# Patient Record
Sex: Female | Born: 1995 | Race: Black or African American | Hispanic: No | Marital: Single | Smoking: Never smoker
Health system: Southern US, Community
[De-identification: ages and names within clinical notes are randomized; demographics above are authoritative.]

## PROBLEM LIST (undated history)

## (undated) ENCOUNTER — Inpatient Hospital Stay (HOSPITAL_COMMUNITY): Payer: Self-pay

## (undated) DIAGNOSIS — I514 Myocarditis, unspecified: Secondary | ICD-10-CM

---

## 2016-07-26 ENCOUNTER — Emergency Department (HOSPITAL_COMMUNITY)
Admission: EM | Admit: 2016-07-26 | Discharge: 2016-07-27 | Disposition: A | Discharge status: Home / Self Care | Payer: Medicaid Other | Attending: Emergency Medicine | Admitting: Emergency Medicine

## 2016-07-26 ENCOUNTER — Encounter (HOSPITAL_COMMUNITY): Payer: Self-pay | Admitting: Emergency Medicine

## 2016-07-26 DIAGNOSIS — Z91040 Latex allergy status: Secondary | ICD-10-CM | POA: Diagnosis not present

## 2016-07-26 DIAGNOSIS — Z3A01 Less than 8 weeks gestation of pregnancy: Secondary | ICD-10-CM | POA: Diagnosis not present

## 2016-07-26 DIAGNOSIS — N898 Other specified noninflammatory disorders of vagina: Secondary | ICD-10-CM

## 2016-07-26 DIAGNOSIS — R102 Pelvic and perineal pain: Secondary | ICD-10-CM | POA: Diagnosis not present

## 2016-07-26 DIAGNOSIS — O209 Hemorrhage in early pregnancy, unspecified: Secondary | ICD-10-CM | POA: Insufficient documentation

## 2016-07-26 LAB — WET PREP, GENITAL
Clue Cells Wet Prep HPF POC: NONE SEEN
Sperm: NONE SEEN
Trich, Wet Prep: NONE SEEN
Yeast Wet Prep HPF POC: NONE SEEN

## 2016-07-26 LAB — URINALYSIS, ROUTINE W REFLEX MICROSCOPIC
BILIRUBIN URINE: NEGATIVE
Glucose, UA: NEGATIVE mg/dL
HGB URINE DIPSTICK: NEGATIVE
KETONES UR: NEGATIVE mg/dL
NITRITE: NEGATIVE
PROTEIN: NEGATIVE mg/dL
Specific Gravity, Urine: 1.025 (ref 1.005–1.030)
pH: 5 (ref 5.0–8.0)

## 2016-07-26 LAB — HCG, QUANTITATIVE, PREGNANCY: hCG, Beta Chain, Quant, S: 6125 m[IU]/mL — ABNORMAL HIGH (ref <5)

## 2016-07-26 LAB — POC URINE PREG, ED: Preg Test, Ur: POSITIVE — AB

## 2016-07-26 NOTE — ED Provider Notes (Signed)
[facility]-EMERGENCY DEPT Provider Note   CSN: [id number] Arrival date & time: 07/26/16  1950  By signing my name below, I, Soijett Blue, attest that this documentation has been prepared under the direction and in the presence of Burna FortsJeff Jacelyn Cuen, PA-C Electronically Signed: Soijett Blue, ED Scribe. 07/26/16. 9:36 PM.  History   Chief Complaint Chief Complaint  Patient presents with  . Vaginal Discharge    HPI Nicole Mosley is a 21 y.o. female who presents to the Emergency Department complaining of white cloudy vaginal discharge onset 1 week ago. Pt notes that she took a generic plan B 8 days ago prior to the onset of her symptoms. Patient's last menstrual period was 06/15/2016 and she hasn't had a menstrual cycle for the month of January. Pt notes that she has been pregnant in the past that resulted in a miscarriage at 2 weeks. She is having associated symptoms of lower abdominal cramping. She denies vaginal bleeding, difficulty urinating, and any other symptoms.     The history is provided by the patient. No language interpreter was used.    History reviewed. No pertinent past medical history.  There are no active problems to display for this patient.   History reviewed. No pertinent surgical history.  OB History    No data available       Home Medications    Prior to Admission medications   Medication Sig Start Date End Date Taking? Authorizing Provider  Prenatal Vit-Fe Fumarate-FA (PRENATAL COMPLETE) 14-0.4 MG TABS Take 1 tablet by mouth daily. 07/27/16   Eyvonne MechanicJeffrey Nephtali Docken, PA-C    Family History History reviewed. No pertinent family history.  Social History Social History  Substance Use Topics  . Smoking status: Never Smoker  . Smokeless tobacco: Never Used  . Alcohol use Yes     Comment: occ     Allergies   Latex   Review of Systems Review of Systems  Gastrointestinal: Positive for abdominal pain (cramping).  Genitourinary: Positive for vaginal  discharge. Negative for difficulty urinating and vaginal bleeding.   Physical Exam Updated Vital Signs BP 94/81 (BP Location: Right Arm)   Pulse 75   Resp 16   Ht 5\' 2"  (1.575 m)   Wt 53.5 kg   LMP 06/15/2016 (Exact Date)   SpO2 100%   BMI 21.58 kg/m   Physical Exam  Constitutional: She is oriented to person, place, and time. She appears well-developed and well-nourished. No distress.  HENT:  Head: Normocephalic and atraumatic.  Eyes: EOM are normal.  Neck: Neck supple.  Cardiovascular: Normal rate.   Pulmonary/Chest: Effort normal. No respiratory distress.  Abdominal: Soft. She exhibits no distension. There is tenderness in the suprapubic area.  Minimal suprapubic tenderness.  Genitourinary:  Genitourinary Comments: Chaperone present for exam. Small amount of nonpurulent vaginal discharge- no cervical motion tenderness, cervical os closed  Musculoskeletal: Normal range of motion.  Neurological: She is alert and oriented to person, place, and time.  Skin: Skin is warm and dry.  Psychiatric: She has a normal mood and affect. Her behavior is normal.  Nursing note and vitals reviewed.  ED Treatments / Results  DIAGNOSTIC STUDIES:   COORDINATION OF CARE: 9:33 PM Discussed treatment plan with pt at bedside which includes labs, UA, GC/Chlamydia probe, and pt agreed to plan.   Labs (all labs ordered are listed, but only abnormal results are displayed) Labs Reviewed  WET PREP, GENITAL - Abnormal; Notable for the following:       Result Value  WBC, Wet Prep HPF POC MANY (*)    All other components within normal limits  URINALYSIS, ROUTINE W REFLEX MICROSCOPIC - Abnormal; Notable for the following:    Leukocytes, UA SMALL (*)    Bacteria, UA RARE (*)    Squamous Epithelial / LPF 0-5 (*)    All other components within normal limits  HCG, QUANTITATIVE, PREGNANCY - Abnormal; Notable for the following:    hCG, Beta Chain, Quant, S 6,125 (*)    All other components within  normal limits  POC URINE PREG, ED - Abnormal; Notable for the following:    Preg Test, Ur POSITIVE (*)    All other components within normal limits  GC/CHLAMYDIA PROBE AMP ([place]) NOT AT [facility]    EKG  EKG Interpretation None       Radiology US Ob Comp Less 14 Wks  Result Date: 07/27/2016 CLINICAL DATA:  21 y/o F; 1 week of pelvic pain and vaginal discharge. Beta HCG 6,125. EXAM: OBSTETRIC <14 WK [place] AND TRANSVAGINAL OB US DOPPLER ULTRASOUND OF OVARIES TECHNIQUE: Both transabdominal and transvaginal ultrasound examinations were performed for complete evaluation of the gestation as well as the maternal uterus, adnexal regions, and pelvic cul-de-sac. Transvaginal technique was performed to assess early pregnancy. Color and duplex Doppler ultrasound was utilized to evaluate blood flow to the ovaries. COMPARISON:  None. FINDINGS: Intrauterine gestational sac: Single Yolk sac:  Not Visualized. Embryo:  Not Visualized. Cardiac Activity: Not Visualized. MSD: 6  mm   5 w   2  d Subchorionic hemorrhage:  None visualized. Maternal uterus/adnexae: The right ovary measures 4.5 x 2.9 x 3.3 cm. There are 2 cysts with internal echoes 1 measuring up to 1.9 cm and the other measuring up to 2.2 cm likely representing hemorrhagic cysts and/or a corpus luteum. The left ovary measures 3.0 x 1.7 x 2.7 cm. Pulsed Doppler evaluation of both ovaries demonstrates normal appearing low-resistance arterial and venous waveforms. IMPRESSION: 1. Probable early intrauterine gestational sac, but no yolk sac, fetal pole, or cardiac activity yet visualized. Recommend follow-up quantitative B-HCG levels and follow-up US in 14 days to assess viability. This recommendation follows SRU consensus guidelines: Diagnostic Criteria for Nonviable Pregnancy Early in the First Trimester. Malva Limes Med 2013; 454:0981-19. 2. Right ovary complex cyst measuring up to 2.2 cm likely representing a hemorrhagic cyst and corpus luteum. 3. No evidence of  ovarian torsion at this time. Electronically Signed   By: Mitzi Hansen M.D.   On: 07/27/2016 01:06   US Ob Transvaginal  Result Date: 07/27/2016 CLINICAL DATA:  21 y/o F; 1 week of pelvic pain and vaginal discharge. Beta HCG 6,125. EXAM: OBSTETRIC <14 WK [place] AND TRANSVAGINAL OB US DOPPLER ULTRASOUND OF OVARIES TECHNIQUE: Both transabdominal and transvaginal ultrasound examinations were performed for complete evaluation of the gestation as well as the maternal uterus, adnexal regions, and pelvic cul-de-sac. Transvaginal technique was performed to assess early pregnancy. Color and duplex Doppler ultrasound was utilized to evaluate blood flow to the ovaries. COMPARISON:  None. FINDINGS: Intrauterine gestational sac: Single Yolk sac:  Not Visualized. Embryo:  Not Visualized. Cardiac Activity: Not Visualized. MSD: 6  mm   5 w   2  d Subchorionic hemorrhage:  None visualized. Maternal uterus/adnexae: The right ovary measures 4.5 x 2.9 x 3.3 cm. There are 2 cysts with internal echoes 1 measuring up to 1.9 cm and the other measuring up to 2.2 cm likely representing hemorrhagic cysts and/or a corpus luteum. The left ovary  measures 3.0 x 1.7 x 2.7 cm. Pulsed Doppler evaluation of both ovaries demonstrates normal appearing low-resistance arterial and venous waveforms. IMPRESSION: 1. Probable early intrauterine gestational sac, but no yolk sac, fetal pole, or cardiac activity yet visualized. Recommend follow-up quantitative B-HCG levels and follow-up US in 14 days to assess viability. This recommendation follows SRU consensus guidelines: Diagnostic Criteria for Nonviable Pregnancy Early in the First Trimester. Malva Limes Med 2013; 161:0960-45. 2. Right ovary complex cyst measuring up to 2.2 cm likely representing a hemorrhagic cyst and corpus luteum. 3. No evidence of ovarian torsion at this time. Electronically Signed   By: Mitzi Hansen M.D.   On: 07/27/2016 01:06   [place] Art/ven Flow Abd Pelv  Doppler  Result Date: 07/27/2016 CLINICAL DATA:  21 y/o F; 1 week of pelvic pain and vaginal discharge. Beta HCG 6,125. EXAM: OBSTETRIC <14 WK [place] AND TRANSVAGINAL OB US DOPPLER ULTRASOUND OF OVARIES TECHNIQUE: Both transabdominal and transvaginal ultrasound examinations were performed for complete evaluation of the gestation as well as the maternal uterus, adnexal regions, and pelvic cul-de-sac. Transvaginal technique was performed to assess early pregnancy. Color and duplex Doppler ultrasound was utilized to evaluate blood flow to the ovaries. COMPARISON:  None. FINDINGS: Intrauterine gestational sac: Single Yolk sac:  Not Visualized. Embryo:  Not Visualized. Cardiac Activity: Not Visualized. MSD: 6  mm   5 w   2  d Subchorionic hemorrhage:  None visualized. Maternal uterus/adnexae: The right ovary measures 4.5 x 2.9 x 3.3 cm. There are 2 cysts with internal echoes 1 measuring up to 1.9 cm and the other measuring up to 2.2 cm likely representing hemorrhagic cysts and/or a corpus luteum. The left ovary measures 3.0 x 1.7 x 2.7 cm. Pulsed Doppler evaluation of both ovaries demonstrates normal appearing low-resistance arterial and venous waveforms. IMPRESSION: 1. Probable early intrauterine gestational sac, but no yolk sac, fetal pole, or cardiac activity yet visualized. Recommend follow-up quantitative B-HCG levels and follow-up US in 14 days to assess viability. This recommendation follows SRU consensus guidelines: Diagnostic Criteria for Nonviable Pregnancy Early in the First Trimester. Malva Limes Med 2013; 409:8119-14. 2. Right ovary complex cyst measuring up to 2.2 cm likely representing a hemorrhagic cyst and corpus luteum. 3. No evidence of ovarian torsion at this time. Electronically Signed   By: Mitzi Hansen M.D.   On: 07/27/2016 01:06    Procedures Procedures (including critical care time)  Medications Ordered in ED Medications  acetaminophen (TYLENOL) tablet 650 mg (650 mg Oral Given  07/27/16 0147)     Initial Impression / Assessment and Plan / ED Course  I have reviewed the triage vital signs and the nursing notes.  Pertinent labs & imaging results that were available during my care of the patient were reviewed by me and considered in my medical decision making (see chart for details).  Clinical Course     Labs: HCG 6125  Imaging: Ultrasound  Consults:   Therapeutics: Tylenol  Discharge Meds: Prenatal vitamins  Assessment/Plan: 21 year old female presents today with vaginal discharge and pelvic pain. Patient pregnancy test positive, hCG 6125. Ultrasound shows likely intrauterine pregnancy, but will need follow-up evaluation in 2 weeks for this. Patient is very well-appearing with soft abdomen. Her abdominal pain is coming and going; no bleeding. No signs of torsion, no signs of ectopic at this time. Patient started on prenatal vitamins, encouraged follow-up with [facility], and repeat ultrasound in 2 weeks. She is given strict return cautioned, she verbalized understanding and agreement  to today's plan had no further questions or concerns  Final Clinical Impressions(s) / ED Diagnoses   Final diagnoses:  Pelvic pain  Less than [redacted] weeks gestation of pregnancy  Vaginal discharge    New Prescriptions Discharge Medication List as of 07/27/2016  1:49 AM    START taking these medications   Details  Prenatal Vit-Fe Fumarate-FA (PRENATAL COMPLETE) 14-0.4 MG TABS Take 1 tablet by mouth daily., Starting Tue 07/27/2016, Print       I personally performed the services described in this documentation, which was scribed in my presence. The recorded information has been reviewed and is accurate.     Eyvonne Mechanic, PA-C 07/27/16 0501    Lyndal Pulley, MD 07/27/16 [phone]

## 2016-07-26 NOTE — ED Triage Notes (Signed)
Pt from home with complaint "I took plan B last Sunday And since I've had abd pain, breast tenderness, and vaginal irritation/discharge" Denies vomiting/diarrhea.

## 2016-07-27 ENCOUNTER — Emergency Department (HOSPITAL_COMMUNITY): Payer: Medicaid Other

## 2016-07-27 LAB — GC/CHLAMYDIA PROBE AMP (~~LOC~~) NOT AT ARMC
Chlamydia: NEGATIVE
Neisseria Gonorrhea: NEGATIVE

## 2016-07-27 MED ORDER — ACETAMINOPHEN 325 MG PO TABS
650.0000 mg | ORAL_TABLET | Freq: Once | ORAL | Status: AC
  Administered 2016-07-27: 650 mg via ORAL
  Filled 2016-07-27: qty 2

## 2016-07-27 MED ORDER — PRENATAL COMPLETE 14-0.4 MG PO TABS: 1.0000 | ORAL_TABLET | Freq: Every day | ORAL | 0 refills | Status: AC

## 2016-07-27 NOTE — Discharge Instructions (Signed)
Please read attached information. If you experience any new or worsening signs or symptoms please return to the emergency room for evaluation. [facility] and [facility]. You will need repeat ultrasound in two weeks.. Please use medication prescribed only as directed and discontinue taking if you have any concerning signs or symptoms.

## 2016-07-27 NOTE — ED Notes (Signed)
Patient transported to US 

## 2016-07-27 NOTE — ED Notes (Signed)
Pt returned from US

## 2016-07-27 NOTE — ED Notes (Signed)
Pt unable to sign upon discharge due to signature pad not working. Pt verbalized understanding of discharge instructions, follow up care and prescriptions. Pt has no further questions. VSS.

## 2016-07-30 ENCOUNTER — Telehealth: Payer: Self-pay | Admitting: *Deleted

## 2016-07-30 DIAGNOSIS — O3680X0 Pregnancy with inconclusive fetal viability, not applicable or unspecified: Secondary | ICD-10-CM

## 2016-07-30 NOTE — Telephone Encounter (Signed)
Patient called, stated she was in ed on the 15th and was told she needed to call clinic to schedule a f/u u/s   1140 Scheduled u/s for 1/19@1400 . Called patient and notified her of appt. Understanding voiced

## 2016-08-09 ENCOUNTER — Encounter: Payer: Self-pay | Admitting: Obstetrics and Gynecology

## 2016-08-09 ENCOUNTER — Ambulatory Visit: Payer: Self-pay | Admitting: *Deleted

## 2016-08-09 ENCOUNTER — Ambulatory Visit (HOSPITAL_COMMUNITY)
Admission: RE | Admit: 2016-08-09 | Discharge: 2016-08-09 | Disposition: A | Discharge status: Home / Self Care | Payer: Medicaid Other | Source: Ambulatory Visit | Attending: Obstetrics and Gynecology | Admitting: Obstetrics and Gynecology

## 2016-08-09 ENCOUNTER — Ambulatory Visit (HOSPITAL_COMMUNITY): Payer: Self-pay

## 2016-08-09 ENCOUNTER — Other Ambulatory Visit: Payer: Self-pay | Admitting: Obstetrics and Gynecology

## 2016-08-09 DIAGNOSIS — O3680X0 Pregnancy with inconclusive fetal viability, not applicable or unspecified: Secondary | ICD-10-CM | POA: Insufficient documentation

## 2016-08-09 DIAGNOSIS — Z3A01 Less than 8 weeks gestation of pregnancy: Secondary | ICD-10-CM | POA: Insufficient documentation

## 2016-08-09 DIAGNOSIS — Z712 Person consulting for explanation of examination or test findings: Secondary | ICD-10-CM

## 2016-08-09 NOTE — Progress Notes (Signed)
Single viable IUP verified by US today.  EDD - 03/31/17.  Pt informed of ultrasound results after reviewed with Dr. Shawnie PonsPratt. Medication reconciliation completed.

## 2016-11-18 ENCOUNTER — Inpatient Hospital Stay (HOSPITAL_COMMUNITY)
Admission: AD | Admit: 2016-11-18 | Discharge: 2016-11-19 | Disposition: A | Discharge status: Home / Self Care | Payer: Medicaid Other | Source: Ambulatory Visit | Attending: Obstetrics and Gynecology | Admitting: Obstetrics and Gynecology

## 2016-11-18 DIAGNOSIS — Z3A21 21 weeks gestation of pregnancy: Secondary | ICD-10-CM

## 2016-11-18 DIAGNOSIS — O26892 Other specified pregnancy related conditions, second trimester: Secondary | ICD-10-CM | POA: Diagnosis not present

## 2016-11-18 DIAGNOSIS — O9989 Other specified diseases and conditions complicating pregnancy, childbirth and the puerperium: Secondary | ICD-10-CM | POA: Diagnosis not present

## 2016-11-18 DIAGNOSIS — A084 Viral intestinal infection, unspecified: Secondary | ICD-10-CM | POA: Insufficient documentation

## 2016-11-18 DIAGNOSIS — R197 Diarrhea, unspecified: Secondary | ICD-10-CM | POA: Diagnosis present

## 2016-11-18 HISTORY — DX: Myocarditis, unspecified: I51.4

## 2016-11-18 LAB — URINALYSIS, ROUTINE W REFLEX MICROSCOPIC
BILIRUBIN URINE: NEGATIVE
GLUCOSE, UA: NEGATIVE mg/dL
HGB URINE DIPSTICK: NEGATIVE
KETONES UR: NEGATIVE mg/dL
Leukocytes, UA: NEGATIVE
Nitrite: NEGATIVE
PROTEIN: NEGATIVE mg/dL
Specific Gravity, Urine: 1.002 — ABNORMAL LOW (ref 1.005–1.030)
pH: 6 (ref 5.0–8.0)

## 2016-11-19 ENCOUNTER — Encounter (HOSPITAL_COMMUNITY): Payer: Self-pay | Admitting: *Deleted

## 2016-11-19 DIAGNOSIS — O9989 Other specified diseases and conditions complicating pregnancy, childbirth and the puerperium: Secondary | ICD-10-CM

## 2016-11-19 DIAGNOSIS — A084 Viral intestinal infection, unspecified: Secondary | ICD-10-CM

## 2016-11-19 NOTE — MAU Provider Note (Signed)
History     CSN: [id number]  Arrival date and time: 11/18/16 2315   First Provider Initiated Contact with Patient 11/19/16 [phone]      Chief Complaint  Patient presents with  . Diarrhea   Nicole PearMona Lissa Mosley is a 21 y.o. G1P0 at 2676w1d who presents today with diarrhea. She states that she has had diarrhea 6x since yesterday. She denies any vagainl bleeding or leaking of fluid. No OB related complaints at this time.    Diarrhea   This is a new problem. The current episode started yesterday. The problem occurs 5 to 10 times per day. The problem has been unchanged. The stool consistency is described as watery. The patient states that diarrhea does not awaken her from sleep. Associated symptoms include chills and a fever ("unmeasured, but felt hot"). Pertinent negatives include no vomiting. There are no known risk factors. She has tried nothing for the symptoms.    Past Medical History:  Diagnosis Date  . Myocarditis (HCC)     History reviewed. No pertinent surgical history.  History reviewed. No pertinent family history.  Social History  Substance Use Topics  . Smoking status: Never Smoker  . Smokeless tobacco: Never Used  . Alcohol use No     Comment: occ    Allergies:  Allergies  Allergen Reactions  . Latex Rash    Prescriptions Prior to Admission  Medication Sig Dispense Refill Last Dose  . Prenatal Vit-Fe Fumarate-FA (PRENATAL COMPLETE) 14-0.4 MG TABS Take 1 tablet by mouth daily. 60 each 0 Taking    Review of Systems  Constitutional: Positive for chills and fever ("unmeasured, but felt hot").  Gastrointestinal: Positive for diarrhea and nausea. Negative for vomiting.  Genitourinary: Negative for vaginal bleeding and vaginal discharge.   Physical Exam   Blood pressure 98/64, pulse (!) 106, temperature 99.1 F (37.3 C), temperature source Oral, resp. rate 18, height 5\' 2"  (1.575 m), weight 134 lb 4 oz (60.9 kg), last menstrual period 06/14/2016.  Physical  Exam  Nursing note and vitals reviewed. Constitutional: She is oriented to person, place, and time. She appears well-developed and well-nourished. No distress.  HENT:  Head: Normocephalic.  Cardiovascular: Normal rate.   Respiratory: Effort normal.  GI: Soft. There is no tenderness. There is no rebound.  Neurological: She is alert and oriented to person, place, and time.  Skin: Skin is warm and dry.  Psychiatric: She has a normal mood and affect.   FHT 155 with doppler   Results for orders placed or performed during the hospital encounter of 11/18/16 (from the past 24 hour(s))  Urinalysis, Routine w reflex microscopic     Status: Abnormal   Collection Time: 11/18/16 11:20 PM  Result Value Ref Range   Color, Urine STRAW (A) YELLOW   APPearance CLEAR CLEAR   Specific Gravity, Urine 1.002 (L) 1.005 - 1.030   pH 6.0 5.0 - 8.0   Glucose, UA NEGATIVE NEGATIVE mg/dL   Hgb urine dipstick NEGATIVE NEGATIVE   Bilirubin Urine NEGATIVE NEGATIVE   Ketones, ur NEGATIVE NEGATIVE mg/dL   Protein, ur NEGATIVE NEGATIVE mg/dL   Nitrite NEGATIVE NEGATIVE   Leukocytes, UA NEGATIVE NEGATIVE    MAU Course  Procedures  MDM   Assessment and Plan   1. Viral gastroenteritis   2. [redacted] weeks gestation of pregnancy    DC home Comfort measures reviewed  BRAT Diet  2nd Trimester precautions  RX: none  Return to MAU as needed FU with OB as planned  Follow-up  Information    Ob/Gyn, [facility] Follow up.   Contact information: [address] [place] [address] [phone]            Thressa Sheller 11/19/2016, 1:38 AM

## 2016-11-19 NOTE — MAU Note (Signed)
PT  SAYS SHE WENT  TO [facility]  ON Monday  FOR  VAG  BLEEDING-   BP,   HAD UC  BC OF DEHYDRATION, GAVE  TYLENOL,   DOPPLER.    [facility] .       WED  SHE WENT OUT  TO EAT AT [employer]  - STARTED HAVING DIARRHEA,  FELT COLD AND  HOT,   NO VOMITING .   NOONE ELSE  IN HOUSE  BEEN SICK.

## 2016-11-19 NOTE — Discharge Instructions (Signed)
Food Choices to Help Relieve Diarrhea, Adult When you have diarrhea, the foods you eat and your eating habits are very important. Choosing the right foods and drinks can help:  Relieve diarrhea.  Replace lost fluids and nutrients.  Prevent dehydration.  What general guidelines should I follow? Relieving diarrhea  Choose foods with less than 2 g or .07 oz. of fiber per serving.  Limit fats to less than 8 tsp (38 g or 1.34 oz.) a day.  Avoid the following: ? Foods and beverages sweetened with high-fructose corn syrup, honey, or sugar alcohols such as xylitol, sorbitol, and mannitol. ? Foods that contain a lot of fat or sugar. ? Fried, greasy, or spicy foods. ? High-fiber grains, breads, and cereals. ? Raw fruits and vegetables.  Eat foods that are rich in probiotics. These foods include dairy products such as yogurt and fermented milk products. They help increase healthy bacteria in the stomach and intestines (gastrointestinal tract, or GI tract).  If you have lactose intolerance, avoid dairy products. These may make your diarrhea worse.  Take medicine to help stop diarrhea (antidiarrheal medicine) only as told by your health care provider. Replacing nutrients  Eat small meals or snacks every 3-4 hours.  Eat bland foods, such as white rice, toast, or baked potato, until your diarrhea starts to get better. Gradually reintroduce nutrient-rich foods as tolerated or as told by your health care provider. This includes: ? Well-cooked protein foods. ? Peeled, seeded, and soft-cooked fruits and vegetables. ? Low-fat dairy products.  Take vitamin and mineral supplements as told by your health care provider. Preventing dehydration   Start by sipping water or a special solution to prevent dehydration (oral rehydration solution, ORS). Urine that is clear or pale yellow means that you are getting enough fluid.  Try to drink at least 8-10 cups of fluid each day to help replace lost  fluids.  You may add other liquids in addition to water, such as clear juice or decaffeinated sports drinks, as tolerated or as told by your health care provider.  Avoid drinks with caffeine, such as coffee, tea, or soft drinks.  Avoid alcohol. What foods are recommended? The items listed may not be a complete list. Talk with your health care provider about what dietary choices are best for you. Grains White rice. White, French, or pita breads (fresh or toasted), including plain rolls, buns, or bagels. White pasta. Saltine, soda, or graham crackers. Pretzels. Low-fiber cereal. Cooked cereals made with water (such as cornmeal, farina, or cream cereals). Plain muffins. Matzo. Melba toast. Zwieback. Vegetables Potatoes (without the skin). Most well-cooked and canned vegetables without skins or seeds. Tender lettuce. Fruits Apple sauce. Fruits canned in juice. Cooked apricots, cherries, grapefruit, peaches, pears, or plums. Fresh bananas and cantaloupe. Meats and other protein foods Baked or boiled chicken. Eggs. Tofu. Fish. Seafood. Smooth nut butters. Ground or well-cooked tender beef, ham, veal, lamb, pork, or poultry. Dairy Plain yogurt, kefir, and unsweetened liquid yogurt. Lactose-free milk, buttermilk, skim milk, or soy milk. Low-fat or nonfat hard cheese. Beverages Water. Low-calorie sports drinks. Fruit juices without pulp. Strained tomato and vegetable juices. Decaffeinated teas. Sugar-free beverages not sweetened with sugar alcohols. Oral rehydration solutions, if approved by your health care provider. Seasoning and other foods Bouillon, broth, or soups made from recommended foods. What foods are not recommended? The items listed may not be a complete list. Talk with your health care provider about what dietary choices are best for you. Grains Whole grain, whole wheat,   bran, or rye breads, rolls, pastas, and crackers. Wild or brown rice. Whole grain or bran cereals. Barley. Oats and  oatmeal. Corn tortillas or taco shells. Granola. Popcorn. Vegetables Raw vegetables. Fried vegetables. Cabbage, broccoli, Brussels sprouts, artichokes, baked beans, beet greens, corn, kale, legumes, peas, sweet potatoes, and yams. Potato skins. Cooked spinach and cabbage. Fruits Dried fruit, including raisins and dates. Raw fruits. Stewed or dried prunes. Canned fruits with syrup. Meat and other protein foods Fried or fatty meats. Deli meats. Chunky nut butters. Nuts and seeds. Beans and lentils. Bacon. Hot dogs. Sausage. Dairy High-fat cheeses. Whole milk, chocolate milk, and beverages made with milk, such as milk shakes. Half-and-half. Cream. sour cream. Ice cream. Beverages Caffeinated beverages (such as coffee, tea, soda, or energy drinks). Alcoholic beverages. Fruit juices with pulp. Prune juice. Soft drinks sweetened with high-fructose corn syrup or sugar alcohols. High-calorie sports drinks. Fats and oils Butter. Cream sauces. Margarine. Salad oils. Plain salad dressings. Olives. Avocados. Mayonnaise. Sweets and desserts Sweet rolls, doughnuts, and sweet breads. Sugar-free desserts sweetened with sugar alcohols such as xylitol and sorbitol. Seasoning and other foods Honey. Hot sauce. Chili powder. Gravy. Cream-based or milk-based soups. Pancakes and waffles. Summary  When you have diarrhea, the foods you eat and your eating habits are very important.  Make sure you get at least 8-10 cups of fluid each day, or enough to keep your urine clear or pale yellow.  Eat bland foods and gradually reintroduce healthy, nutrient-rich foods as tolerated, or as told by your health care provider.  Avoid high-fiber, fried, greasy, or spicy foods. This information is not intended to replace advice given to you by your health care provider. Make sure you discuss any questions you have with your health care provider. Document Released: 09/18/2003 Document Revised: 06/25/2016 Document Reviewed:  06/25/2016 Elsevier Interactive Patient Education  2017 [employer]  

## 2017-01-12 ENCOUNTER — Encounter (HOSPITAL_COMMUNITY): Payer: Self-pay

## 2017-01-12 ENCOUNTER — Inpatient Hospital Stay (HOSPITAL_COMMUNITY)
Admission: AD | Admit: 2017-01-12 | Discharge: 2017-01-12 | Disposition: A | Discharge status: Home / Self Care | Payer: Medicaid Other | Source: Ambulatory Visit | Attending: Family Medicine | Admitting: Family Medicine

## 2017-01-12 DIAGNOSIS — R0789 Other chest pain: Secondary | ICD-10-CM | POA: Insufficient documentation

## 2017-01-12 DIAGNOSIS — Z3A29 29 weeks gestation of pregnancy: Secondary | ICD-10-CM | POA: Diagnosis not present

## 2017-01-12 DIAGNOSIS — O26893 Other specified pregnancy related conditions, third trimester: Secondary | ICD-10-CM | POA: Insufficient documentation

## 2017-01-12 DIAGNOSIS — R079 Chest pain, unspecified: Secondary | ICD-10-CM | POA: Diagnosis present

## 2017-01-12 DIAGNOSIS — O9989 Other specified diseases and conditions complicating pregnancy, childbirth and the puerperium: Secondary | ICD-10-CM | POA: Diagnosis not present

## 2017-01-12 LAB — URINALYSIS, ROUTINE W REFLEX MICROSCOPIC
BILIRUBIN URINE: NEGATIVE
Glucose, UA: 50 mg/dL — AB
HGB URINE DIPSTICK: NEGATIVE
KETONES UR: NEGATIVE mg/dL
Leukocytes, UA: NEGATIVE
Nitrite: NEGATIVE
Protein, ur: NEGATIVE mg/dL
Specific Gravity, Urine: 1.011 (ref 1.005–1.030)
pH: 6 (ref 5.0–8.0)

## 2017-01-12 LAB — TROPONIN I: Troponin I: <0.03 ng/mL (ref <0.03)

## 2017-01-12 MED ORDER — CYCLOBENZAPRINE HCL 5 MG PO TABS: 5.0000 mg | ORAL_TABLET | Freq: Three times a day (TID) | ORAL | 0 refills | Status: DC | PRN

## 2017-01-12 MED ORDER — CYCLOBENZAPRINE HCL 5 MG PO TABS
5.0000 mg | ORAL_TABLET | Freq: Once | ORAL | Status: DC
  Filled 2017-01-12: qty 1

## 2017-01-12 NOTE — Progress Notes (Addendum)
G1@ 28.[redacted] wksga. Presents to triage for  chest pain that woke her up. Occurs mostly during movements including breathing. Hx of myocarditis in 2016  EKG done by RN Christina with [facility]  Denies LOF or bleeding. Feels movement of baby. EFM applied.   Provider at bs assessing pt.   1710: pt resting quietly with eyes closed. Pending labs.   1805: Discharge instructions given with pt understanding. Pt left unit via ambulatory with SO.   Computer issue in this room. Using Wow and signature pad unavailable on this computer. Obtained signature on paper form. See pt;s chart.

## 2017-01-12 NOTE — MAU Note (Signed)
Having really bad chest pain.  The pain woke her this morning.  Feels like her chest is tightening up, happens with breathing and activity or movement.  Had this pain before when she was dx with myocarditis

## 2017-01-12 NOTE — MAU Provider Note (Signed)
Chief Complaint:  Chest Pain   First Provider Initiated Contact with Patient 01/12/17 1550     HPI: Nicole Mosley is a 21 y.o. G1P0 at 68w6dwho presents to maternity admissions reporting chest pain since morning.  Pain is substernal, a little to the right upper chest..  Tightens and is sharp at times. Worried it is like her myocarditis pain.  Note below was reviewed. Pt states she is not taking Toprol, never did. She reports good fetal movement, denies LOF, vaginal bleeding, vaginal itching/burning, urinary symptoms, h/a, dizziness, n/v, diarrhea, constipation or fever/chills.  She denies headache, visual changes or RUQ abdominal pain.  Chest Pain   This is a new problem. The current episode started today. The problem occurs intermittently. The problem has been unchanged. The pain is present in the substernal region and lateral region. The pain is mild. The quality of the pain is described as pressure and sharp. The pain does not radiate. Pertinent negatives include no abdominal pain, back pain, cough, dizziness, leg pain, malaise/fatigue, nausea, orthopnea, palpitations, shortness of breath or vomiting. The pain is aggravated by nothing. She has tried nothing for the symptoms.    RN Note: Having really bad chest pain.  The pain woke her this morning.  Feels like her chest is tightening up, happens with breathing and activity or movement.  Had this pain before when she was dx with myocarditis  Cardiology note (03/25/16): History of myocarditis in 2016. She appears to have made a full recovery. Today we performed a stress test where she went for 12 minutes on the Bruce protocol and had no symptoms and no EKG changes and no arrhythmias. In addition we repeated an echocardiogram which was unremarkable with normal left ventricular function. Based on her asymptomatic status, results of today's testing, I find no contraindications for her to be involved in exercise/physical activity. 2. Decrease  Toprol-XL to 50 mg daily. 3. Return to clinic one year.      Past Medical History: Past Medical History:  Diagnosis Date  . Myocarditis (HCC)     Past obstetric history: OB History  Gravida Para Term Preterm AB Living  1            SAB TAB Ectopic Multiple Live Births               # Outcome Date GA Lbr Len/2nd Weight Sex Delivery Anes PTL Lv  1 Current               Past Surgical History: No past surgical history on file.  Family History: No family history on file.  Social History: Social History  Substance Use Topics  . Smoking status: Never Smoker  . Smokeless tobacco: Never Used  . Alcohol use No     Comment: occ    Allergies:  Allergies  Allergen Reactions  . Latex Rash    Meds:  Prescriptions Prior to Admission  Medication Sig Dispense Refill Last Dose  . Prenatal Vit-Fe Fumarate-FA (PRENATAL COMPLETE) 14-0.4 MG TABS Take 1 tablet by mouth daily. 60 each 0 Taking    I have reviewed patient's Past Medical Hx, Surgical Hx, Family Hx, Social Hx, medications and allergies.   ROS:  Review of Systems  Constitutional: Negative for malaise/fatigue.  Respiratory: Negative for cough and shortness of breath.   Cardiovascular: Positive for chest pain. Negative for palpitations and orthopnea.  Gastrointestinal: Negative for abdominal pain, nausea and vomiting.  Musculoskeletal: Negative for back pain.  Neurological: Negative for dizziness.  Other systems negative  Physical Exam  Patient Vitals for the past 24 hrs:  BP Temp Temp src Pulse Resp SpO2 Weight  01/12/17 1537 110/71 98.7 F (37.1 C) Oral 99 18 100 % 136 lb (61.7 kg)  01/12/17 1535 - - - - - 100 % -   Constitutional: Well-developed, well-nourished female in no acute distress.  Cardiovascular: normal rate and rhythm Respiratory: normal effort, clear to auscultation bilaterally GI: Abd soft, non-tender, gravid appropriate for gestational age.   No rebound or guarding. MS: Extremities  nontender, no edema, normal ROM Neurologic: Alert and oriented x 4.  GU: Neg CVAT.  PELVIC EXAM: deferred  FHT:  Baseline 140 , moderate variability, accelerations present, no decelerations Contractions:   Rare   Labs: Results for orders placed or performed during the hospital encounter of 01/12/17 (from the past 72 hour(s))  Urinalysis, Routine w reflex microscopic     Status: Abnormal   Collection Time: 01/12/17  3:41 PM  Result Value Ref Range   Color, Urine YELLOW YELLOW   APPearance CLEAR CLEAR   Specific Gravity, Urine 1.011 1.005 - 1.030   pH 6.0 5.0 - 8.0   Glucose, UA 50 (A) NEGATIVE mg/dL   Hgb urine dipstick NEGATIVE NEGATIVE   Bilirubin Urine NEGATIVE NEGATIVE   Ketones, ur NEGATIVE NEGATIVE mg/dL   Protein, ur NEGATIVE NEGATIVE mg/dL   Nitrite NEGATIVE NEGATIVE   Leukocytes, UA NEGATIVE NEGATIVE  Troponin I     Status: None   Collection Time: 01/12/17  4:15 PM  Result Value Ref Range   Troponin I <0.03 <0.03 ng/mL       Imaging:  No results found.  MAU Course/MDM: I have ordered labs and reviewed results. Troponin and EKG NST reviewed and is reassuring Consult Dr Alvester MorinNewton with presentation, exam findings and test results.  Treatments in MAU included Flexeril which did result in some relief of pain.  Per Dr Alvester MorinNewton, EKG is normal, and troponin is normal, pain is likely costochondritis, so Flexeril is recommended. .    Assessment: SIUP at 592w2d Chest wall pain - Plan: Discharge patient    Plan: Discharge home Rx Flexeril for chest wall pain Supportive care Labor precautions and fetal kick counts Follow up in Office for prenatal visits and recheck of status Follow up with Cardiologist prn  Encouraged to return here or to other Urgent Care/ED if she develops worsening of symptoms, increase in pain, fever, or other concerning symptoms.   Pt stable at time of discharge.  Wynelle BourgeoisMarie Vaibhav Mosley CNM, MSN Certified Nurse-Midwife 01/12/2017 3:50 PM

## 2017-01-12 NOTE — Discharge Instructions (Signed)
Chest Wall Pain °Chest wall pain is pain in or around the bones and muscles of your chest. Sometimes, an injury causes this pain. Sometimes, the cause may not be known. This pain may take several weeks or longer to get better. °Follow these instructions at home: °Pay attention to any changes in your symptoms. Take these actions to help with your pain: °· Rest as told by your health care provider. °· Avoid activities that cause pain. These include any activities that use your chest muscles or your abdominal and side muscles to lift heavy items. °· If directed, apply ice to the painful area: °¨ Put ice in a plastic bag. °¨ Place a towel between your skin and the bag. °¨ Leave the ice on for 20 minutes, 2-3 times per day. °· Take over-the-counter and prescription medicines only as told by your health care provider. °· Do not use tobacco products, including cigarettes, chewing tobacco, and e-cigarettes. If you need help quitting, ask your health care provider. °· Keep all follow-up visits as told by your health care provider. This is important. °Contact a health care provider if: °· You have a fever. °· Your chest pain becomes worse. °· You have new symptoms. °Get help right away if: °· You have nausea or vomiting. °· You feel sweaty or light-headed. °· You have a cough with phlegm (sputum) or you cough up blood. °· You develop shortness of breath. °This information is not intended to replace advice given to you by your health care provider. Make sure you discuss any questions you have with your health care provider. °Document Released: 06/28/2005 Document Revised: 11/06/2015 Document Reviewed: 09/23/2014 °Elsevier Interactive Patient Education © 2017 [employer] ° °

## 2017-06-23 ENCOUNTER — Encounter (HOSPITAL_COMMUNITY): Payer: Self-pay

## 2017-08-01 DIAGNOSIS — W231XXA Caught, crushed, jammed, or pinched between stationary objects, initial encounter: Secondary | ICD-10-CM | POA: Insufficient documentation

## 2017-08-01 DIAGNOSIS — Y998 Other external cause status: Secondary | ICD-10-CM | POA: Insufficient documentation

## 2017-08-01 DIAGNOSIS — S6992XA Unspecified injury of left wrist, hand and finger(s), initial encounter: Secondary | ICD-10-CM | POA: Insufficient documentation

## 2017-08-01 DIAGNOSIS — Z79899 Other long term (current) drug therapy: Secondary | ICD-10-CM | POA: Insufficient documentation

## 2017-08-01 DIAGNOSIS — Y929 Unspecified place or not applicable: Secondary | ICD-10-CM | POA: Insufficient documentation

## 2017-08-01 DIAGNOSIS — Y939 Activity, unspecified: Secondary | ICD-10-CM | POA: Insufficient documentation

## 2017-08-02 ENCOUNTER — Emergency Department (HOSPITAL_COMMUNITY): Payer: Self-pay

## 2017-08-02 ENCOUNTER — Encounter (HOSPITAL_COMMUNITY): Payer: Self-pay | Admitting: Emergency Medicine

## 2017-08-02 ENCOUNTER — Emergency Department (HOSPITAL_COMMUNITY)
Admission: EM | Admit: 2017-08-02 | Discharge: 2017-08-02 | Disposition: A | Discharge status: Home / Self Care | Payer: Self-pay | Attending: Emergency Medicine | Admitting: Emergency Medicine

## 2017-08-02 DIAGNOSIS — S6992XA Unspecified injury of left wrist, hand and finger(s), initial encounter: Secondary | ICD-10-CM

## 2017-08-02 MED ORDER — NAPROXEN 375 MG PO TABS: 375.0000 mg | ORAL_TABLET | Freq: Two times a day (BID) | ORAL | 0 refills | Status: DC

## 2017-08-02 NOTE — ED Triage Notes (Signed)
Pt reports closing her R ring finger in door. Bleeding to nail noted.

## 2017-08-02 NOTE — ED Provider Notes (Signed)
[facility] EMERGENCY DEPARTMENT Provider Note   CSN: [id number] Arrival date & time: 08/01/17  2356     History   Chief Complaint Chief Complaint  Patient presents with  . Hand Pain    HPI Vergene Marland Tatar is a 22 y.o. female.  Patient presents with injury to left 4th finger after it caught in a closing car door. No other injury. She reports bleeding around the finger nail which has a lengthy false nail attached. The injury occurred just prior to arrival.    The history is provided by the patient. No language interpreter was used.  Hand Pain     Past Medical History:  Diagnosis Date  . Myocarditis (HCC)     There are no active problems to display for this patient.   History reviewed. No pertinent surgical history.  OB History    Gravida Para Term Preterm AB Living   1             SAB TAB Ectopic Multiple Live Births                   Home Medications    Prior to Admission medications   Medication Sig Start Date End Date Taking? Authorizing Provider  cyclobenzaprine (FLEXERIL) 5 MG tablet Take 1 tablet (5 mg total) by mouth every 8 (eight) hours as needed for muscle spasms. 01/12/17   Aviva Signs, CNM  Prenatal Vit-Fe Fumarate-FA (PRENATAL COMPLETE) 14-0.4 MG TABS Take 1 tablet by mouth daily. 07/27/16   Eyvonne Mechanic, PA-C    Family History No family history on file.  Social History Social History   Tobacco Use  . Smoking status: Never Smoker  . Smokeless tobacco: Never Used  Substance Use Topics  . Alcohol use: No    Comment: occ  . Drug use: No    Comment: occ     Allergies   Latex   Review of Systems Review of Systems  Musculoskeletal:       See HPI.  Skin: Positive for wound.  Neurological: Negative.      Physical Exam Updated Vital Signs BP 116/90   Pulse (!) 107   Temp 98.1 F (36.7 C) (Oral)   Resp 18   Ht 5\' 1"  (1.549 m)   Wt 61.2 kg (135 lb)   LMP 07/30/2017 (Exact Date)   SpO2 100%    BMI 25.51 kg/m   Physical Exam  Constitutional: She is oriented to person, place, and time. She appears well-developed and well-nourished.  Neck: Normal range of motion.  Pulmonary/Chest: Effort normal.  Musculoskeletal:  Left 4th finger has no bony deformity. The nail is attached without significant avulsion but has been bleeding. Unable to survey for extent of subungual hematoma.   Neurological: She is alert and oriented to person, place, and time.  Skin: Skin is warm and dry.     ED Treatments / Results  Labs (all labs ordered are listed, but only abnormal results are displayed) Labs Reviewed - No data to display  EKG  EKG Interpretation None       Radiology Dg Finger Ring Right  Result Date: 08/02/2017 CLINICAL DATA:  Shut ring finger in car door EXAM: RIGHT RING FINGER 2+V COMPARISON:  None. FINDINGS: Possible nondisplaced tuft fracture fourth distal phalanx on the lateral view. No subluxation. No radiopaque foreign body. IMPRESSION: Possible nondisplaced tuft fracture of the fourth distal phalanx Electronically Signed   By: Jasmine Pang M.D.   On: 08/02/2017  00:31    Procedures Procedures (including critical care time)  Medications Ordered in ED Medications - No data to display   Initial Impression / Assessment and Plan / ED Course  I have reviewed the triage vital signs and the nursing notes.  Pertinent labs & imaging results that were available during my care of the patient were reviewed by me and considered in my medical decision making (see chart for details).     Patient here with uncomplicated finger injury. Imagine suggests tuft fracture. Finger was soaked to clean and bulky dressing applied. Will update tetanus. Follow up with Dr. Izora Ribasoley.  Final Clinical Impressions(s) / ED Diagnoses   Final diagnoses:  None   1. Left finger injury   ED Discharge Orders    None       Elpidio AnisUpstill, Kimble Delaurentis, PA-C 08/02/17 0248    Devoria AlbeKnapp, Iva, MD 08/02/17  [phone]

## 2017-08-02 NOTE — Discharge Instructions (Signed)
Follow up with Dr. Izora Ribasoley as directed. Take Naproxen for pain. Keep the hand elevated and apply cool compresses to help with any swelling.

## 2018-04-23 ENCOUNTER — Emergency Department (HOSPITAL_COMMUNITY)
Admission: EM | Admit: 2018-04-23 | Discharge: 2018-04-23 | Disposition: A | Discharge status: Home / Self Care | Payer: Medicaid Other | Attending: Emergency Medicine | Admitting: Emergency Medicine

## 2018-04-23 ENCOUNTER — Encounter (HOSPITAL_COMMUNITY): Payer: Self-pay | Admitting: Emergency Medicine

## 2018-04-23 ENCOUNTER — Emergency Department (HOSPITAL_COMMUNITY): Payer: Medicaid Other

## 2018-04-23 DIAGNOSIS — O26891 Other specified pregnancy related conditions, first trimester: Secondary | ICD-10-CM | POA: Diagnosis not present

## 2018-04-23 DIAGNOSIS — Z79899 Other long term (current) drug therapy: Secondary | ICD-10-CM | POA: Insufficient documentation

## 2018-04-23 DIAGNOSIS — Z3A11 11 weeks gestation of pregnancy: Secondary | ICD-10-CM | POA: Diagnosis not present

## 2018-04-23 DIAGNOSIS — Z91040 Latex allergy status: Secondary | ICD-10-CM | POA: Diagnosis not present

## 2018-04-23 DIAGNOSIS — R1030 Lower abdominal pain, unspecified: Secondary | ICD-10-CM | POA: Diagnosis not present

## 2018-04-23 DIAGNOSIS — R109 Unspecified abdominal pain: Secondary | ICD-10-CM

## 2018-04-23 DIAGNOSIS — Z3A10 10 weeks gestation of pregnancy: Secondary | ICD-10-CM | POA: Diagnosis not present

## 2018-04-23 DIAGNOSIS — O9989 Other specified diseases and conditions complicating pregnancy, childbirth and the puerperium: Secondary | ICD-10-CM | POA: Diagnosis not present

## 2018-04-23 DIAGNOSIS — O208 Other hemorrhage in early pregnancy: Secondary | ICD-10-CM | POA: Diagnosis not present

## 2018-04-23 LAB — COMPREHENSIVE METABOLIC PANEL
ALBUMIN: 3.4 g/dL — AB (ref 3.5–5.0)
ALK PHOS: 36 U/L — AB (ref 38–126)
ALT: 16 U/L (ref 0–44)
AST: 22 U/L (ref 15–41)
Anion gap: 6 (ref 5–15)
BUN: 7 mg/dL (ref 6–20)
CALCIUM: 9.1 mg/dL (ref 8.9–10.3)
CO2: 22 mmol/L (ref 22–32)
CREATININE: 0.56 mg/dL (ref 0.44–1.00)
Chloride: 108 mmol/L (ref 98–111)
GFR calc Af Amer: >60 mL/min (ref >60)
GFR calc non Af Amer: >60 mL/min (ref >60)
GLUCOSE: 93 mg/dL (ref 70–99)
Potassium: 3.4 mmol/L — ABNORMAL LOW (ref 3.5–5.1)
Sodium: 136 mmol/L (ref 135–145)
Total Bilirubin: 0.4 mg/dL (ref 0.3–1.2)
Total Protein: 6.6 g/dL (ref 6.5–8.1)

## 2018-04-23 LAB — URINALYSIS, ROUTINE W REFLEX MICROSCOPIC
Bilirubin Urine: NEGATIVE
Glucose, UA: NEGATIVE mg/dL
Hgb urine dipstick: NEGATIVE
Ketones, ur: 20 mg/dL — AB
Leukocytes, UA: NEGATIVE
Nitrite: NEGATIVE
PROTEIN: NEGATIVE mg/dL
Specific Gravity, Urine: 1.026 (ref 1.005–1.030)
pH: 6 (ref 5.0–8.0)

## 2018-04-23 LAB — LIPASE, BLOOD: Lipase: 25 U/L (ref 11–51)

## 2018-04-23 LAB — CBC
HCT: 38.5 % (ref 36.0–46.0)
Hemoglobin: 12.5 g/dL (ref 12.0–15.0)
MCH: 29.1 pg (ref 26.0–34.0)
MCHC: 32.5 g/dL (ref 30.0–36.0)
MCV: 89.5 fL (ref 80.0–100.0)
PLATELETS: 227 10*3/uL (ref 150–400)
RBC: 4.3 MIL/uL (ref 3.87–5.11)
RDW: 12.3 % (ref 11.5–15.5)
WBC: 8.7 10*3/uL (ref 4.0–10.5)
nRBC: 0 % (ref 0.0–0.2)

## 2018-04-23 LAB — HCG, QUANTITATIVE, PREGNANCY: hCG, Beta Chain, Quant, S: 142647 m[IU]/mL — ABNORMAL HIGH (ref <5)

## 2018-04-23 NOTE — Discharge Instructions (Signed)
We recommend Tylenol as needed for management of pain.  Your ultrasound showed no evidence of any complicating features.  Continue with prenatal care as scheduled.  You may return for new or concerning symptoms.

## 2018-04-23 NOTE — ED Triage Notes (Signed)
Pt presents with abd pain, took preg test = POSITIVE; pt states LMP 7/26; no prenatal care so far

## 2018-04-23 NOTE — ED Notes (Signed)
Pt discharged from ED; instructions provided; Pt encouraged to return to ED if symptoms worsen and to f/u with PCP; Pt verbalized understanding of all instructions 

## 2018-04-23 NOTE — ED Provider Notes (Signed)
[facility] EMERGENCY DEPARTMENT Provider Note   CSN: [id number] Arrival date & time: 04/23/18  0007     History   Chief Complaint Chief Complaint  Patient presents with  . Possible Pregnancy  . Abdominal Pain    HPI Nicole Mosley is a 22 y.o. female.   22 year old G24P1 female, currently pregnant (LMP 7/26), presents to the emergency department for evaluation of abdominal pain.  Reports fairly consistent crampy, pressure-like pain in her lower abdomen since onset of pregnancy.  States that symptoms wax and wane in severity.  She expresses concern as this discomfort has not subsided as it did with prior pregnancies.  She has not taken any medications for her symptoms.  Denies any vaginal bleeding, vaginal discharge, fevers, dysuria, hematuria.  Has been taking a prenatal vitamin.  No current prenatal care, but does have an appointment scheduled for Tuesday.  She felt she could not wait this long given concern for ongoing symptoms.     Past Medical History:  Diagnosis Date  . Myocarditis (HCC)     There are no active problems to display for this patient.   History reviewed. No pertinent surgical history.   OB History    Gravida  1   Para      Term      Preterm      AB      Living        SAB      TAB      Ectopic      Multiple      Live Births               Home Medications    Prior to Admission medications   Medication Sig Start Date End Date Taking? Authorizing Provider  Prenatal Vit-Fe Fumarate-FA (PRENATAL COMPLETE) 14-0.4 MG TABS Take 1 tablet by mouth daily. 07/27/16  Yes Hedges, Tinnie Gens, PA-C  cyclobenzaprine (FLEXERIL) 5 MG tablet Take 1 tablet (5 mg total) by mouth every 8 (eight) hours as needed for muscle spasms. Patient not taking: Reported on 04/23/2018 01/12/17   Aviva Signs, CNM  naproxen (NAPROSYN) 375 MG tablet Take 1 tablet (375 mg total) by mouth 2 (two) times daily. Patient not taking: Reported  on 04/23/2018 08/02/17   Elpidio Anis, PA-C    Family History History reviewed. No pertinent family history.  Social History Social History   Tobacco Use  . Smoking status: Never Smoker  . Smokeless tobacco: Never Used  Substance Use Topics  . Alcohol use: No    Comment: occ  . Drug use: No    Comment: occ     Allergies   Latex   Review of Systems Review of Systems Ten systems reviewed and are negative for acute change, except as noted in the HPI.    Physical Exam Updated Vital Signs BP 108/67   Pulse 82   Temp 99 F (37.2 C) (Oral)   Resp 14   Ht 5\' 1"  (1.549 m)   Wt 59 kg   LMP 02/03/2018   SpO2 100%   BMI 24.56 kg/m   Physical Exam  Constitutional: She is oriented to person, place, and time. She appears well-developed and well-nourished. No distress.  Nontoxic appearing and in NAD  HENT:  Head: Normocephalic and atraumatic.  Eyes: Conjunctivae and EOM are normal. No scleral icterus.  Neck: Normal range of motion.  Pulmonary/Chest: Effort normal. No respiratory distress.  Respirations even and unlabored  Abdominal: She  exhibits no distension.  Mildly gravid abdomen  Musculoskeletal: Normal range of motion.  Neurological: She is alert and oriented to person, place, and time. She exhibits normal muscle tone. Coordination normal.  Skin: Skin is warm and dry. No rash noted. She is not diaphoretic. No erythema. No pallor.  Psychiatric: She has a normal mood and affect. Her behavior is normal.  Nursing note and vitals reviewed.    ED Treatments / Results  Labs (all labs ordered are listed, but only abnormal results are displayed) Labs Reviewed  COMPREHENSIVE METABOLIC PANEL - Abnormal; Notable for the following components:      Result Value   Potassium 3.4 (*)    Albumin 3.4 (*)    Alkaline Phosphatase 36 (*)    All other components within normal limits  URINALYSIS, ROUTINE W REFLEX MICROSCOPIC - Abnormal; Notable for the following components:    Ketones, ur 20 (*)    All other components within normal limits  HCG, QUANTITATIVE, PREGNANCY - Abnormal; Notable for the following components:   hCG, Beta Chain, Quant, S 142,647 (*)    All other components within normal limits  LIPASE, BLOOD  CBC    EKG None  Radiology US Ob Comp Less 14 Wks  Result Date: 04/23/2018 CLINICAL DATA:  Abdominal pain during pregnancy. Estimated gestational age by LMP is 11 weeks 2 days. Quantitative beta HCG is 142,647. EXAM: OBSTETRIC <14 WK [place] AND TRANSVAGINAL OB US TECHNIQUE: Both transabdominal and transvaginal ultrasound examinations were performed for complete evaluation of the gestation as well as the maternal uterus, adnexal regions, and pelvic cul-de-sac. Transvaginal technique was performed to assess early pregnancy. COMPARISON:  None. FINDINGS: Intrauterine gestational sac: Single intrauterine pregnancy. Yolk sac: Yolk sac is not visualized consistent with gestational age. Embryo:  Fetal pole is present. Cardiac Activity: Fetal cardiac activity is observed. Heart Rate: 157 bpm CRL: 49 mm   11 w   5 d                  [place] EDC: 11/07/2018 Subchorionic hemorrhage: A small subchorionic hemorrhage is identified posteriorly and inferiorly. Maternal uterus/adnexae: Uterus is anteverted. No myometrial mass lesions identified. Both ovaries are visualized and appear normal. Corpus luteal cyst is seen on the right. No free fluid. IMPRESSION: Single intrauterine pregnancy. Estimated gestational age by crown-rump length is 11 weeks 5 days. No acute complication is demonstrated sonographically. Electronically Signed   By: Burman Nieves M.D.   On: 04/23/2018 06:14   US Ob Transvaginal  Result Date: 04/23/2018 CLINICAL DATA:  Abdominal pain during pregnancy. Estimated gestational age by LMP is 11 weeks 2 days. Quantitative beta HCG is 142,647. EXAM: OBSTETRIC <14 WK [place] AND TRANSVAGINAL OB US TECHNIQUE: Both transabdominal and transvaginal ultrasound examinations were  performed for complete evaluation of the gestation as well as the maternal uterus, adnexal regions, and pelvic cul-de-sac. Transvaginal technique was performed to assess early pregnancy. COMPARISON:  None. FINDINGS: Intrauterine gestational sac: Single intrauterine pregnancy. Yolk sac: Yolk sac is not visualized consistent with gestational age. Embryo:  Fetal pole is present. Cardiac Activity: Fetal cardiac activity is observed. Heart Rate: 157 bpm CRL: 49 mm   11 w   5 d                  [place] EDC: 11/07/2018 Subchorionic hemorrhage: A small subchorionic hemorrhage is identified posteriorly and inferiorly. Maternal uterus/adnexae: Uterus is anteverted. No myometrial mass lesions identified. Both ovaries are visualized and appear normal. Corpus luteal cyst is  seen on the right. No free fluid. IMPRESSION: Single intrauterine pregnancy. Estimated gestational age by crown-rump length is 11 weeks 5 days. No acute complication is demonstrated sonographically. Electronically Signed   By: Burman Nieves M.D.   On: 04/23/2018 06:14    Procedures Procedures (including critical care time)  Medications Ordered in ED Medications - No data to display   Initial Impression / Assessment and Plan / ED Course  I have reviewed the triage vital signs and the nursing notes.  Pertinent labs & imaging results that were available during my care of the patient were reviewed by me and considered in my medical decision making (see chart for details).     22 year old female presents to the emergency department for complaints of abdominal pain in setting of pregnancy.  States that pain has been constant since very early in her pregnancy.  The pain localizes to her suprapubic abdomen and is intimately crampy.  Has not taken any medications for symptoms.  Suspect symptoms to be secondary to stretching of the round ligament.  Laboratory work-up reassuring without leukocytosis.  Urinalysis without evidence of UTI.  Patient  underwent ultrasound which shows no complicating features.  There is a normal intrauterine pregnancy of 11 weeks 5 days with good fetal heart rate.  Patient advised to continue with scheduled prenatal follow-up on Tuesday.  Have advised the use of Tylenol for pain control.  Return precautions discussed and provided. Patient discharged in stable condition with no unaddressed concerns.   Final Clinical Impressions(s) / ED Diagnoses   Final diagnoses:  Abdominal pain during pregnancy in first trimester    ED Discharge Orders    None       Antony Madura, PA-C 04/23/18 1610    Devoria Albe, MD 04/23/18 [phone]

## 2018-04-25 DIAGNOSIS — O3680X0 Pregnancy with inconclusive fetal viability, not applicable or unspecified: Secondary | ICD-10-CM | POA: Diagnosis not present

## 2018-04-25 DIAGNOSIS — Z3201 Encounter for pregnancy test, result positive: Secondary | ICD-10-CM | POA: Diagnosis not present

## 2018-05-16 DIAGNOSIS — Z130 Encounter for screening for diseases of the blood and blood-forming organs and certain disorders involving the immune mechanism: Secondary | ICD-10-CM | POA: Diagnosis not present

## 2018-05-16 DIAGNOSIS — Z3480 Encounter for supervision of other normal pregnancy, unspecified trimester: Secondary | ICD-10-CM | POA: Diagnosis not present

## 2018-05-18 DIAGNOSIS — O09299 Supervision of pregnancy with other poor reproductive or obstetric history, unspecified trimester: Secondary | ICD-10-CM | POA: Diagnosis not present

## 2018-06-13 DIAGNOSIS — Z3689 Encounter for other specified antenatal screening: Secondary | ICD-10-CM | POA: Diagnosis not present

## 2018-06-13 DIAGNOSIS — Z3480 Encounter for supervision of other normal pregnancy, unspecified trimester: Secondary | ICD-10-CM | POA: Diagnosis not present

## 2018-08-15 DIAGNOSIS — Z3480 Encounter for supervision of other normal pregnancy, unspecified trimester: Secondary | ICD-10-CM | POA: Diagnosis not present

## 2018-08-31 ENCOUNTER — Inpatient Hospital Stay (HOSPITAL_COMMUNITY)
Admission: AD | Admit: 2018-08-31 | Discharge: 2018-09-01 | Disposition: A | Discharge status: Home / Self Care | Payer: Medicaid Other | Attending: Obstetrics and Gynecology | Admitting: Obstetrics and Gynecology

## 2018-08-31 ENCOUNTER — Inpatient Hospital Stay (HOSPITAL_COMMUNITY): Payer: Medicaid Other

## 2018-08-31 ENCOUNTER — Encounter (HOSPITAL_COMMUNITY): Payer: Self-pay

## 2018-08-31 DIAGNOSIS — Z3A30 30 weeks gestation of pregnancy: Secondary | ICD-10-CM | POA: Diagnosis not present

## 2018-08-31 DIAGNOSIS — R7989 Other specified abnormal findings of blood chemistry: Secondary | ICD-10-CM | POA: Insufficient documentation

## 2018-08-31 DIAGNOSIS — D696 Thrombocytopenia, unspecified: Secondary | ICD-10-CM | POA: Diagnosis not present

## 2018-08-31 DIAGNOSIS — R05 Cough: Secondary | ICD-10-CM | POA: Insufficient documentation

## 2018-08-31 DIAGNOSIS — R509 Fever, unspecified: Secondary | ICD-10-CM

## 2018-08-31 DIAGNOSIS — Z3A29 29 weeks gestation of pregnancy: Secondary | ICD-10-CM

## 2018-08-31 DIAGNOSIS — R059 Cough, unspecified: Secondary | ICD-10-CM

## 2018-08-31 DIAGNOSIS — O26893 Other specified pregnancy related conditions, third trimester: Secondary | ICD-10-CM | POA: Insufficient documentation

## 2018-08-31 DIAGNOSIS — R1011 Right upper quadrant pain: Secondary | ICD-10-CM

## 2018-08-31 DIAGNOSIS — M62838 Other muscle spasm: Secondary | ICD-10-CM | POA: Diagnosis not present

## 2018-08-31 DIAGNOSIS — O99113 Other diseases of the blood and blood-forming organs and certain disorders involving the immune mechanism complicating pregnancy, third trimester: Secondary | ICD-10-CM | POA: Insufficient documentation

## 2018-08-31 DIAGNOSIS — R82998 Other abnormal findings in urine: Secondary | ICD-10-CM | POA: Diagnosis not present

## 2018-08-31 LAB — URINALYSIS, ROUTINE W REFLEX MICROSCOPIC
Bilirubin Urine: NEGATIVE
GLUCOSE, UA: NEGATIVE mg/dL
Hgb urine dipstick: NEGATIVE
KETONES UR: 15 mg/dL — AB
Nitrite: NEGATIVE
PH: 7 (ref 5.0–8.0)
Protein, ur: NEGATIVE mg/dL
Specific Gravity, Urine: 1.015 (ref 1.005–1.030)

## 2018-08-31 LAB — CBC WITH DIFFERENTIAL/PLATELET
Basophils Absolute: 0 10*3/uL (ref 0.0–0.1)
Basophils Relative: 0 %
Eosinophils Absolute: 0 10*3/uL (ref 0.0–0.5)
Eosinophils Relative: 0 %
HEMATOCRIT: 28.8 % — AB (ref 36.0–46.0)
HEMOGLOBIN: 9.3 g/dL — AB (ref 12.0–15.0)
LYMPHS ABS: 0.8 10*3/uL (ref 0.7–4.0)
Lymphocytes Relative: 13 %
MCH: 28.4 pg (ref 26.0–34.0)
MCHC: 32.3 g/dL (ref 30.0–36.0)
MCV: 88.1 fL (ref 80.0–100.0)
MONOS PCT: 5 %
Monocytes Absolute: 0.3 10*3/uL (ref 0.1–1.0)
NEUTROS PCT: 82 %
Neutro Abs: 4.9 10*3/uL (ref 1.7–7.7)
Platelets: 146 10*3/uL — ABNORMAL LOW (ref 150–400)
RBC: 3.27 MIL/uL — ABNORMAL LOW (ref 3.87–5.11)
RDW: 12.9 % (ref 11.5–15.5)
WBC: 6.1 10*3/uL (ref 4.0–10.5)
nRBC: 0 % (ref 0.0–0.2)

## 2018-08-31 LAB — BASIC METABOLIC PANEL
Anion gap: 7 (ref 5–15)
BUN: <5 mg/dL — ABNORMAL LOW (ref 6–20)
CHLORIDE: 104 mmol/L (ref 98–111)
CO2: 20 mmol/L — AB (ref 22–32)
Calcium: 8.1 mg/dL — ABNORMAL LOW (ref 8.9–10.3)
Creatinine, Ser: 0.53 mg/dL (ref 0.44–1.00)
GFR calc Af Amer: >60 mL/min (ref >60)
GFR calc non Af Amer: >60 mL/min (ref >60)
GLUCOSE: 117 mg/dL — AB (ref 70–99)
POTASSIUM: 3.7 mmol/L (ref 3.5–5.1)
Sodium: 131 mmol/L — ABNORMAL LOW (ref 135–145)

## 2018-08-31 LAB — URINALYSIS, MICROSCOPIC (REFLEX): RBC / HPF: NONE SEEN RBC/hpf (ref 0–5)

## 2018-08-31 LAB — LACTIC ACID, PLASMA: LACTIC ACID, VENOUS: 2.4 mmol/L — AB (ref 0.5–1.9)

## 2018-08-31 LAB — INFLUENZA PANEL BY PCR (TYPE A & B)
INFLAPCR: NEGATIVE
Influenza B By PCR: NEGATIVE

## 2018-08-31 MED ORDER — CYCLOBENZAPRINE HCL 5 MG PO TABS
5.0000 mg | ORAL_TABLET | Freq: Once | ORAL | Status: AC
  Administered 2018-08-31: 5 mg via ORAL
  Filled 2018-08-31: qty 1

## 2018-08-31 MED ORDER — ACETAMINOPHEN 325 MG PO TABS
650.0000 mg | ORAL_TABLET | Freq: Once | ORAL | Status: AC
  Administered 2018-08-31: 650 mg via ORAL
  Filled 2018-08-31: qty 2

## 2018-08-31 MED ORDER — LACTATED RINGERS IV SOLN
Freq: Once | INTRAVENOUS | Status: AC
  Administered 2018-08-31: 21:00:00 via INTRAVENOUS

## 2018-08-31 NOTE — MAU Provider Note (Addendum)
History     CSN: [id number]  Arrival date and time: 08/31/18 4098   First Provider Initiated Contact with Patient 08/31/18 1945      Chief Complaint  Patient presents with  . Fever   HPI Nicole Mosley is a 23 y.o. G2P0 at [redacted]w[redacted]d who presents to MAU with chief complaints of fever, chills, body aches and non-productive cough. These are no problems, onset yesterday. She denies exposure to sick people. She did not receive a flu shot this year. Patient had a filling at the dentist on Tuesday and thinks that is the cause of her illness.  Patient took 500mg  of Tylenol about 30 minutes before signing in to MAU. She is tolerating PO and has been emphasizing PO hydration with water, juice and broth.  She denies vaginal bleeding, leaking of fluid, decreased fetal movement, fever, falls, or recent illness.    OB History    Gravida  2   Para      Term      Preterm      AB      Living        SAB      TAB      Ectopic      Multiple      Live Births              Past Medical History:  Diagnosis Date  . Myocarditis (HCC)     No past surgical history on file.  No family history on file.  Social History   Tobacco Use  . Smoking status: Never Smoker  . Smokeless tobacco: Never Used  Substance Use Topics  . Alcohol use: No    Comment: occ  . Drug use: No    Comment: occ    Allergies:  Allergies  Allergen Reactions  . Latex Rash    Medications Prior to Admission  Medication Sig Dispense Refill Last Dose  . cyclobenzaprine (FLEXERIL) 5 MG tablet Take 1 tablet (5 mg total) by mouth every 8 (eight) hours as needed for muscle spasms. (Patient not taking: Reported on 04/23/2018) 15 tablet 0 Not Taking at Unknown time  . naproxen (NAPROSYN) 375 MG tablet Take 1 tablet (375 mg total) by mouth 2 (two) times daily. (Patient not taking: Reported on 04/23/2018) 20 tablet 0 Not Taking at Unknown time  . Prenatal Vit-Fe Fumarate-FA (PRENATAL COMPLETE) 14-0.4 MG  TABS Take 1 tablet by mouth daily. 60 each 0 04/22/2018 at Unknown time    Review of Systems  Constitutional: Positive for chills, fatigue and fever.  HENT: Positive for dental problem.   Respiratory: Positive for cough.   Gastrointestinal: Negative for abdominal pain, diarrhea, nausea and vomiting.  Genitourinary: Negative for difficulty urinating, flank pain, vaginal bleeding, vaginal discharge and vaginal pain.  Musculoskeletal: Negative for back pain.  Neurological: Negative for dizziness, seizures, syncope, weakness and headaches.   Physical Exam   Blood pressure (!) 116/56, pulse (!) 127, temperature (!) 101.4 F (38.6 C), resp. rate 20, height 5\' 2"  (1.575 m), weight 64.9 kg, last menstrual period 02/03/2018, SpO2 100 %, unknown if currently breastfeeding.  Physical Exam  Nursing note and vitals reviewed. Constitutional: She is oriented to person, place, and time. She appears well-developed and well-nourished.  HENT:  Mouth/Throat: Uvula is midline, oropharynx is clear and moist and mucous membranes are normal. Mucous membranes are not pale and not dry.  Respiratory: Effort normal and breath sounds normal. No respiratory distress.  GI: Soft. She exhibits no distension.  There is no abdominal tenderness. There is no rebound and no guarding.  Neurological: She is alert and oriented to person, place, and time.  Skin: Skin is warm and dry.  Psychiatric: She has a normal mood and affect. Her behavior is normal. Judgment and thought content normal.    MAU Course/MDM   Report given to M. Mayford Knife, CNM who assumes care of patient at this time.  Clayton Bibles, CNM 08/31/18  8:47 PM    Assessment and Plan  Assumed care from prior provider  Fever has come down nicely with Tylenol Patient now complaining of RUQ pain which wraps around to back No reflux Negative Murphys but will get a RUQ [place] to check gallbladder.  Other possibility is ?Pyelonephritis since there are mod  leukocytes and "many bacteria" in urine.   Urine to culture Will try Flexeril 5mg  to see if this is possibly muscle spasm  Will get blood cultures due to fever and elevated Lactic acid.  Flu test resulted as negative No leukocytosis or left shift Mild thrombocytopenia  [place] was negative for cholelithiasis.  Flexeril completely relieved the pain.    US Abdomen Limited Ruq  Result Date: 08/31/2018 CLINICAL DATA:  23 year old female with right upper quadrant pain and fever. EXAM: ULTRASOUND ABDOMEN LIMITED RIGHT UPPER QUADRANT COMPARISON:  None. FINDINGS: Gallbladder: No gallstones or wall thickening visualized. No sonographic Murphy sign noted by sonographer. No pericholecystic fluid. Common bile duct: Diameter: 2 millimeters, normal. Liver: No focal lesion identified. Within normal limits in parenchymal echogenicity. Portal vein is patent on color Doppler imaging with normal direction of blood flow towards the liver. IMPRESSION: Normal right upper quadrant ultrasound. Electronically Signed   By: Odessa Fleming M.D.   On: 08/31/2018 22:24   Patient remained afebrile Pain completely resolved Will send urine for culture but not treat right now  Fever and illness precautions reviewed  A:  Single intrauterine pregnancy at [redacted]w[redacted]d      RUQ pain - Plan: US ABDOMEN LIMITED RUQ, US ABDOMEN LIMITED RUQ, Discharge patient  Fever, unspecified fever cause - Plan: Discharge patient  Muscle spasm - Plan: Discharge patient  Cough - Plan: Discharge patient  Leukocytes in urine - Plan: Discharge patient  P:  WIll discharge home Supportive care Allergies as of 09/01/2018      Reactions   Latex Rash      Medication List    TAKE these medications   acetaminophen 500 MG tablet Commonly known as:  TYLENOL Take 500 mg by mouth every 6 (six) hours as needed for moderate pain.   cyclobenzaprine 5 MG tablet Commonly known as:  FLEXERIL Take 1 tablet (5 mg total) by mouth every 8 (eight) hours as needed for  muscle spasms.   naproxen 375 MG tablet Commonly known as:  NAPROSYN Take 1 tablet (375 mg total) by mouth 2 (two) times daily.   PRENATAL COMPLETE 14-0.4 MG Tabs Take 1 tablet by mouth daily.       Encouraged to return here or to other Urgent Care/ED if she develops worsening of symptoms, increase in pain, fever, or other concerning symptoms.

## 2018-08-31 NOTE — MAU Note (Signed)
VS reported to provider.

## 2018-08-31 NOTE — MAU Note (Signed)
Woke up yesterday feeling "really bad."  Notes fever (didn't take temperature), body aches, and congested cough.  No VB/LOF.  Receives care at [facility].  +FM.  Didn't call OB.

## 2018-09-01 LAB — GROUP A STREP BY PCR: Group A Strep by PCR: NOT DETECTED

## 2018-09-01 NOTE — Discharge Instructions (Signed)
Fever, Adult     A fever is an increase in the body's temperature. It is usually defined as a temperature of 100.70F (38C) or higher. Brief mild or moderate fevers generally have no long-term effects, and they often do not need treatment. Moderate or high fevers may make you feel uncomfortable and can sometimes be a sign of a serious illness or disease. The sweating that may occur with repeated or prolonged fever may also cause a loss of fluid in the body (dehydration). Fever is confirmed by taking a temperature with a thermometer. A measured temperature can vary with:  Age.  Time of day.  Where in the body you take the temperature. Readings may vary if you place the thermometer: ? In the mouth (oral). ? In the rectum (rectal). ? In the ear (tympanic). ? Under the arm (axillary). ? On the forehead (temporal). Follow these instructions at home: Medicines  Take over-the counter and prescription medicines only as told by your health care provider. Follow the dosing instructions carefully.  If you were prescribed an antibiotic medicine, take it as told by your health care provider. Do not stop taking the antibiotic even if you start to feel better. General instructions  Watch your condition for any changes. Let your health care provider know about them.  Rest as needed.  Drink enough fluid to keep your urine pale yellow. This helps to prevent dehydration.  Sponge yourself or bathe with room-temperature water to help reduce your body temperature as needed. Do not use ice water.  Do not use too many blankets or wear clothes that are too heavy.  If your fever may be caused by an infection that spreads from person to person (is contagious), such as a cold or the flu, you should stay home from work and public gatherings for at least 24 hours after your fever is gone. Your fever should be gone without the need to use medicines. Contact a health care provider if:  You vomit.  You cannot  eat or drink without vomiting.  You have diarrhea.  You have pain when you urinate.  Your symptoms do not improve with treatment.  You develop new symptoms.  You develop excessive weakness. Get help right away if:  You have shortness of breath or have trouble breathing.  You are dizzy or you faint.  You are disoriented or confused.  You develop signs of dehydration, such as: ? Dark urine, very little urine, or no urine. ? Cracked lips. ? Dry mouth. ? Sunken eyes. ? Sleepiness. ? Weakness.  You develop severe pain in your abdomen.  You have persistent vomiting or diarrhea.  You develop a skin rash.  Your symptoms suddenly get worse. Summary  A fever is an increase in the body's temperature. It is usually defined as a temperature of 100.70F (38C) or higher. Moderate or high fevers can sometimes be a sign of a serious illness or disease. The sweating that may occur with repeated or prolonged fever may also cause dehydration.  Pay attention to any changes in your symptoms and contact your health care provider if your symptoms do not improve with treatment.  Take over-the counter and prescription medicines only as told by your health care provider. Follow the dosing instructions carefully.  If your fever is from an infection that may be contagious, such as cold or flu, you should stay home from work and public gatherings for at least 24 hours after your fever is gone. Your fever should be gone  without the need to use medicines.  Get help right away if you develop signs of dehydration, such as dark urine, cracked lips, dry mouth, sunken eyes, sleepiness, or weakness. This information is not intended to replace advice given to you by your health care provider. Make sure you discuss any questions you have with your health care provider. Document Released: 12/22/2000 Document Revised: 12/12/2017 Document Reviewed: 12/12/2017 Elsevier Interactive Patient Education  2019  [employer] Upper Respiratory Infection, Adult An upper respiratory infection (URI) affects the nose, throat, and upper air passages. URIs are caused by germs (viruses). The most common type of URI is often called "the common cold." Medicines cannot cure URIs, but you can do things at home to relieve your symptoms. URIs usually get better within 7-10 days. Follow these instructions at home: Activity  Rest as needed.  If you have a fever, stay home from work or school until your fever is gone, or until your doctor says you may return to work or school. ? You should stay home until you cannot spread the infection anymore (you are not contagious). ? Your doctor may have you wear a face mask so you have less risk of spreading the infection. Relieving symptoms  Gargle with a salt-water mixture 3-4 times a day or as needed. To make a salt-water mixture, completely dissolve -1 tsp of salt in 1 cup of warm water.  Use a cool-mist humidifier to add moisture to the air. This can help you breathe more easily. Eating and drinking   Drink enough fluid to keep your pee (urine) pale yellow.  Eat soups and other clear broths. General instructions   Take over-the-counter and prescription medicines only as told by your doctor. These include cold medicines, fever reducers, and cough suppressants.  Do not use any products that contain nicotine or tobacco. These include cigarettes and e-cigarettes. If you need help quitting, ask your doctor.  Avoid being where people are smoking (avoid secondhand smoke).  Make sure you get regular shots and get the flu shot every year.  Keep all follow-up visits as told by your doctor. This is important. How to avoid spreading infection to others   Wash your hands often with soap and water. If you do not have soap and water, use hand sanitizer.  Avoid touching your mouth, face, eyes, or nose.  Cough or sneeze into a tissue or your sleeve or elbow. Do not  cough or sneeze into your hand or into the air. Contact a doctor if:  You are getting worse, not better.  You have any of these: ? A fever. ? Chills. ? Brown or red mucus in your nose. ? Yellow or brown fluid (discharge)coming from your nose. ? Pain in your face, especially when you bend forward. ? Swollen neck glands. ? Pain with swallowing. ? White areas in the back of your throat. Get help right away if:  You have shortness of breath that gets worse.  You have very bad or constant: ? Headache. ? Ear pain. ? Pain in your forehead, behind your eyes, and over your cheekbones (sinus pain). ? Chest pain.  You have long-lasting (chronic) lung disease along with any of these: ? Wheezing. ? Long-lasting cough. ? Coughing up blood. ? A change in your usual mucus.  You have a stiff neck.  You have changes in your: ? Vision. ? Hearing. ? Thinking. ? Mood. Summary  An upper respiratory infection (URI) is caused by a germ called a virus. The  most common type of URI is often called "the common cold."  URIs usually get better within 7-10 days.  Take over-the-counter and prescription medicines only as told by your doctor. This information is not intended to replace advice given to you by your health care provider. Make sure you discuss any questions you have with your health care provider. Document Released: 12/15/2007 Document Revised: 02/18/2017 Document Reviewed: 02/18/2017 Elsevier Interactive Patient Education  2019 [employer].

## 2018-09-02 LAB — CULTURE, OB URINE

## 2018-09-05 LAB — CULTURE, BLOOD (ROUTINE X 2)
CULTURE: NO GROWTH
Culture: NO GROWTH
Special Requests: ADEQUATE
Special Requests: ADEQUATE

## 2018-10-17 DIAGNOSIS — Z3480 Encounter for supervision of other normal pregnancy, unspecified trimester: Secondary | ICD-10-CM | POA: Diagnosis not present

## 2018-10-17 DIAGNOSIS — Z3685 Encounter for antenatal screening for Streptococcus B: Secondary | ICD-10-CM | POA: Diagnosis not present

## 2018-11-09 DIAGNOSIS — Z8759 Personal history of other complications of pregnancy, childbirth and the puerperium: Secondary | ICD-10-CM | POA: Diagnosis not present

## 2018-11-09 DIAGNOSIS — O26893 Other specified pregnancy related conditions, third trimester: Secondary | ICD-10-CM | POA: Diagnosis not present

## 2018-11-09 DIAGNOSIS — Z3A39 39 weeks gestation of pregnancy: Secondary | ICD-10-CM | POA: Diagnosis not present

## 2018-11-09 DIAGNOSIS — R1084 Generalized abdominal pain: Secondary | ICD-10-CM | POA: Diagnosis not present

## 2019-01-10 DIAGNOSIS — N6323 Unspecified lump in the left breast, lower outer quadrant: Secondary | ICD-10-CM | POA: Diagnosis not present

## 2019-01-11 DIAGNOSIS — N6323 Unspecified lump in the left breast, lower outer quadrant: Secondary | ICD-10-CM | POA: Diagnosis not present

## 2019-01-11 DIAGNOSIS — R928 Other abnormal and inconclusive findings on diagnostic imaging of breast: Secondary | ICD-10-CM | POA: Diagnosis not present

## 2019-01-22 DIAGNOSIS — N6323 Unspecified lump in the left breast, lower outer quadrant: Secondary | ICD-10-CM | POA: Diagnosis not present

## 2019-02-02 ENCOUNTER — Encounter (HOSPITAL_COMMUNITY): Payer: Self-pay

## 2019-06-23 IMAGING — US US OB COMP LESS 14 WK
1 series · 13 of 28 positions shown · non-contrast
Comparison: None.

CLINICAL DATA: Abdominal pain during pregnancy. Estimated
gestational age by LMP is 11 weeks 2 days. Quantitative beta HCG is
[DATE].

EXAM:
OBSTETRIC <14 WK US AND TRANSVAGINAL OB US
TECHNIQUE: Both transabdominal and transvaginal ultrasound examinations were
performed for complete evaluation of the gestation as well as the
maternal uterus, adnexal regions, and pelvic cul-de-sac.
Transvaginal technique was performed to assess early pregnancy.

[Series 1: us ob comp less 14 wk · 13 of 60 slices shown]
[im 3/60]
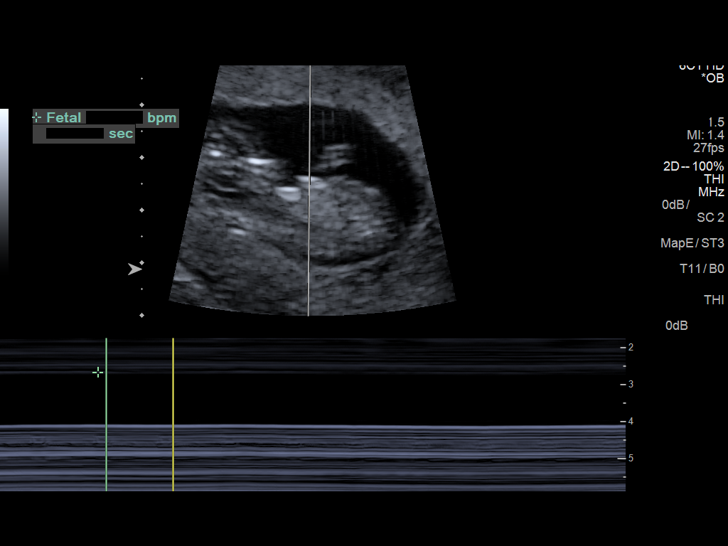
[im 7/60]
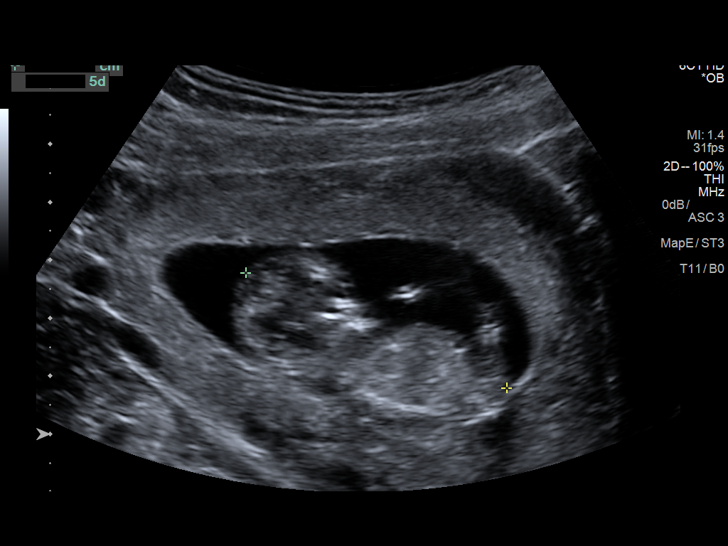
[im 11/60]
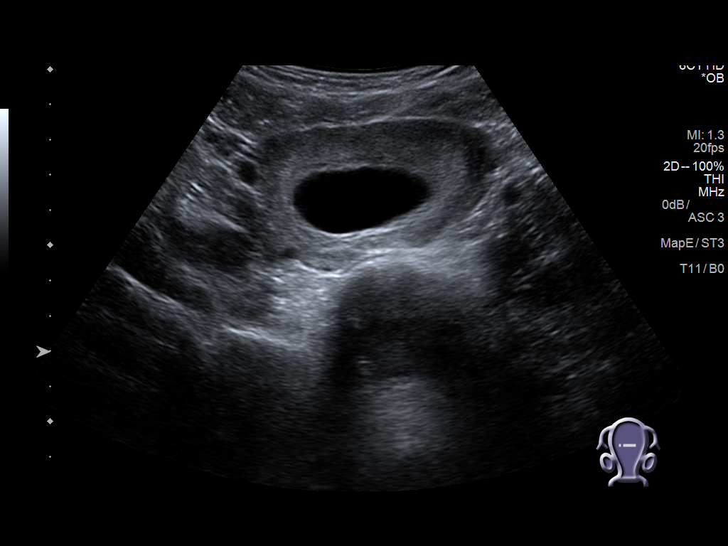
[im 16/60]
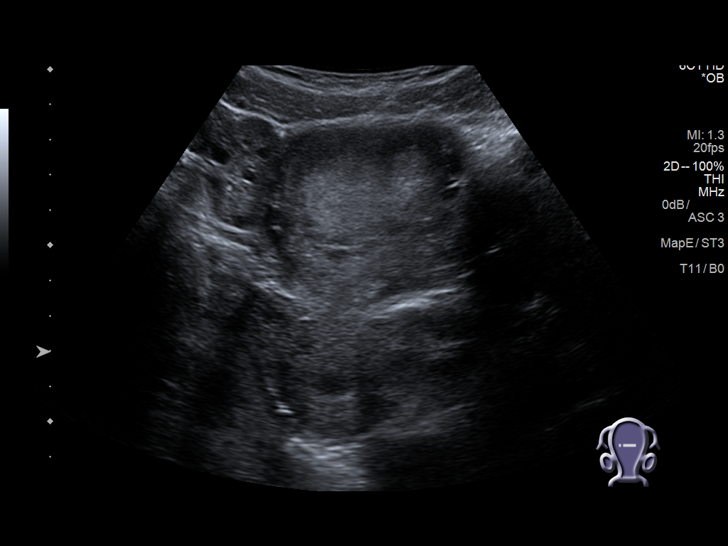
[im 20/60]
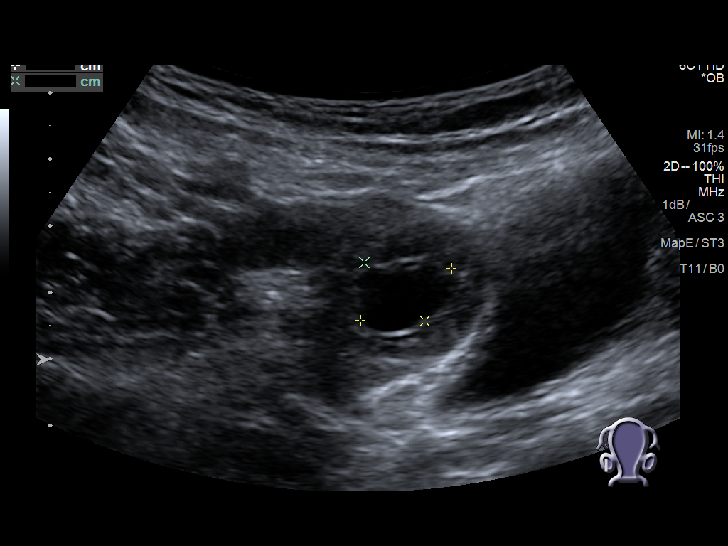
[im 25/60]
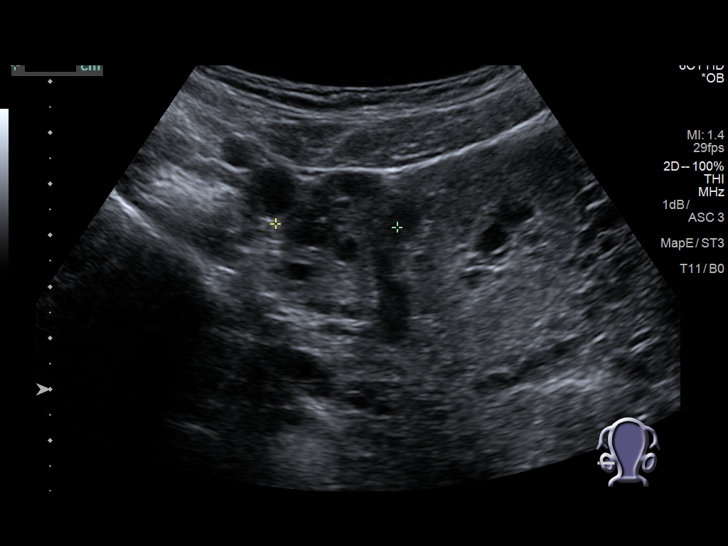
[im 31/60]
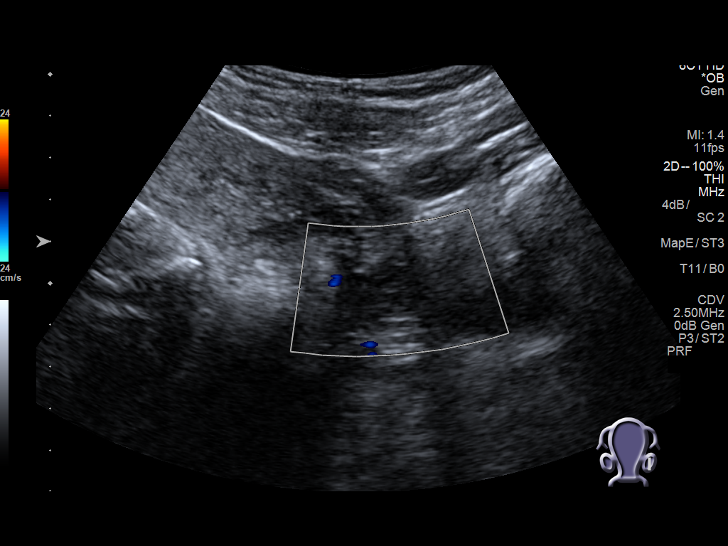
[im 35/60]
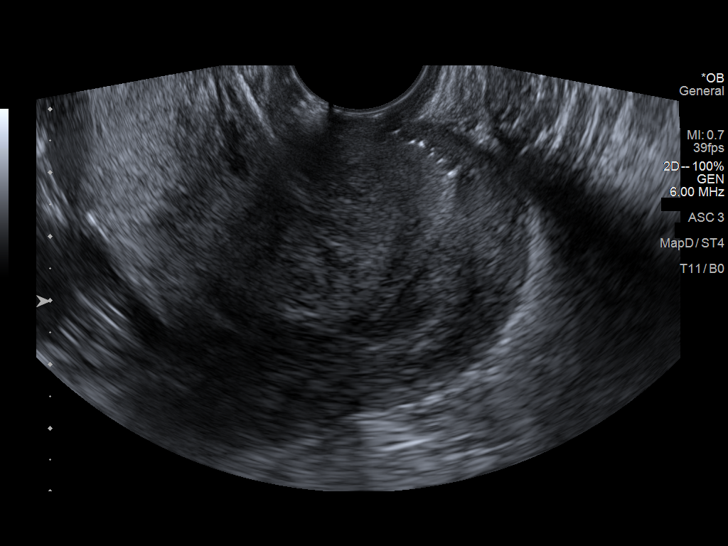
[im 40/60]
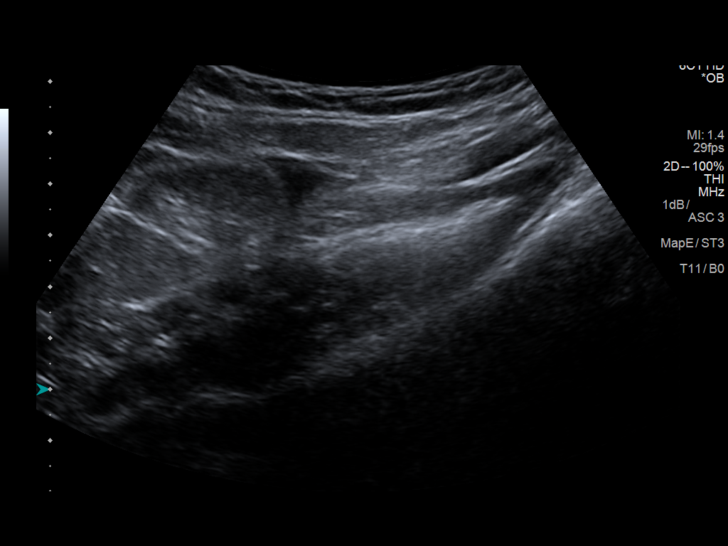
[im 44/60]
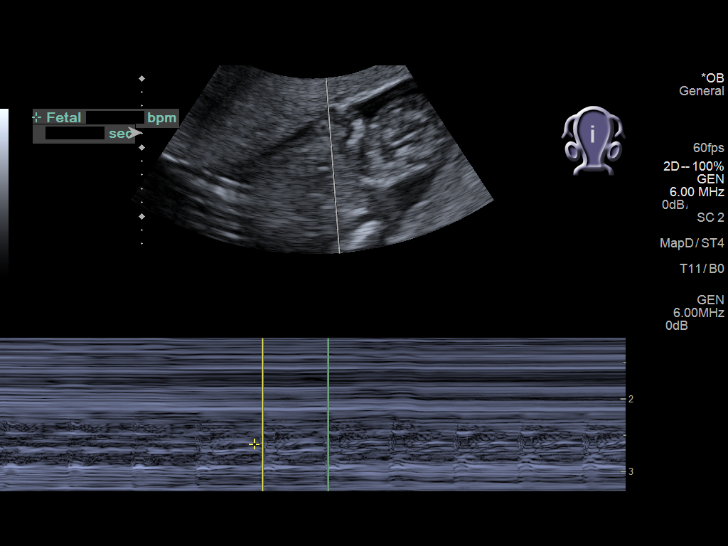
[im 49/60]
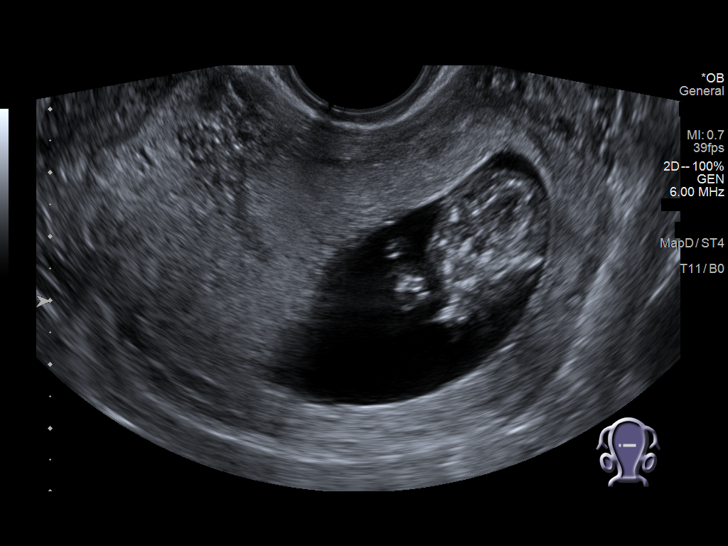
[im 53/60]
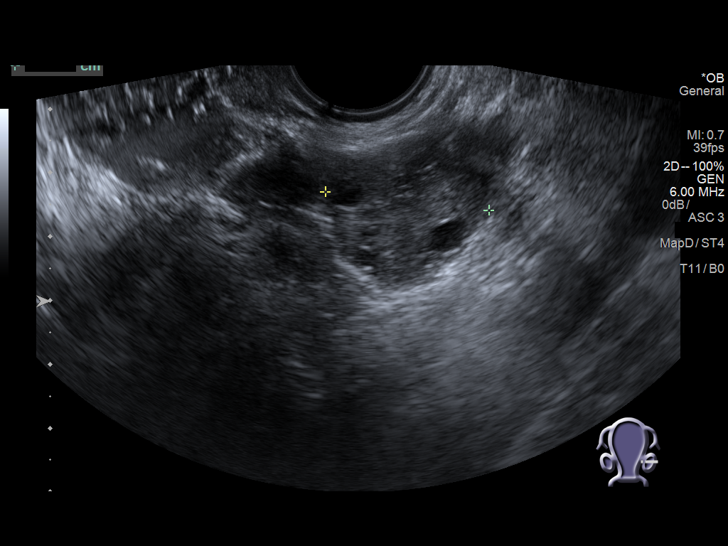
[im 57/60]
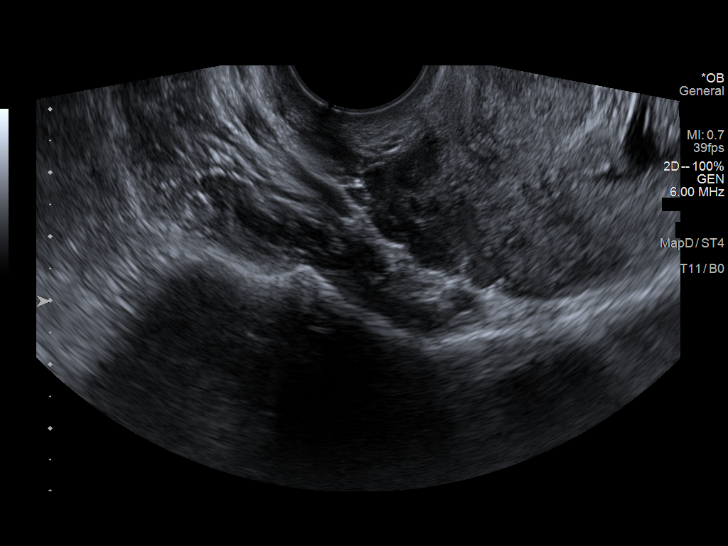

[13 of 28 positions shown; findings below may reference images not displayed]

FINDINGS: Intrauterine gestational sac: Single intrauterine pregnancy.

Yolk sac: Yolk sac is not visualized consistent with gestational
age.

Embryo:  Fetal pole is present.

Cardiac Activity: Fetal cardiac activity is observed.

Heart Rate: 157 bpm

CRL: 49 mm   11 w   5 d                  US EDC: 11/07/2018

Subchorionic hemorrhage: A small subchorionic hemorrhage is
identified posteriorly and inferiorly.

Maternal uterus/adnexae: Uterus is anteverted. No myometrial mass
lesions identified. Both ovaries are visualized and appear normal.
Corpus luteal cyst is seen on the right. No free fluid.
IMPRESSION: Single intrauterine pregnancy. Estimated gestational age by
crown-rump length is 11 weeks 5 days. No acute complication is
demonstrated sonographically.

## 2019-08-18 IMAGING — US US ABDOMEN LIMITED
1 series · 15 of 25 positions shown · non-contrast
Comparison: None.

CLINICAL DATA: 22-year-old female with right upper quadrant pain
and fever.

EXAM:
ULTRASOUND ABDOMEN LIMITED RIGHT UPPER QUADRANT

[Series 1: us abdomen limited · 15 of 30 slices shown]
[im 1/30]
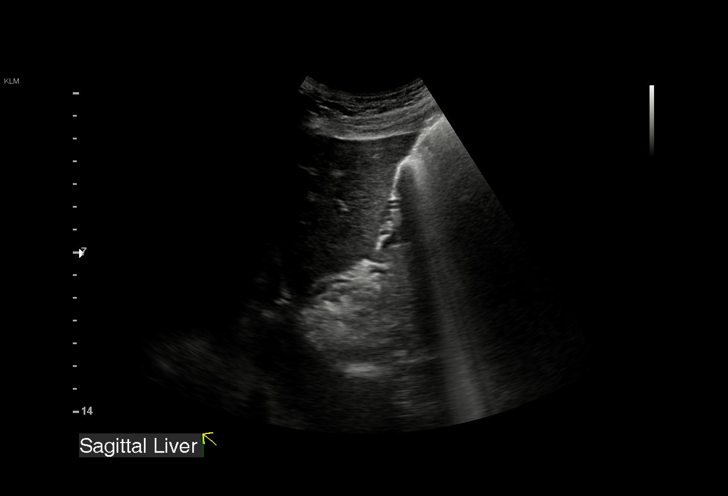
[im 3/30]
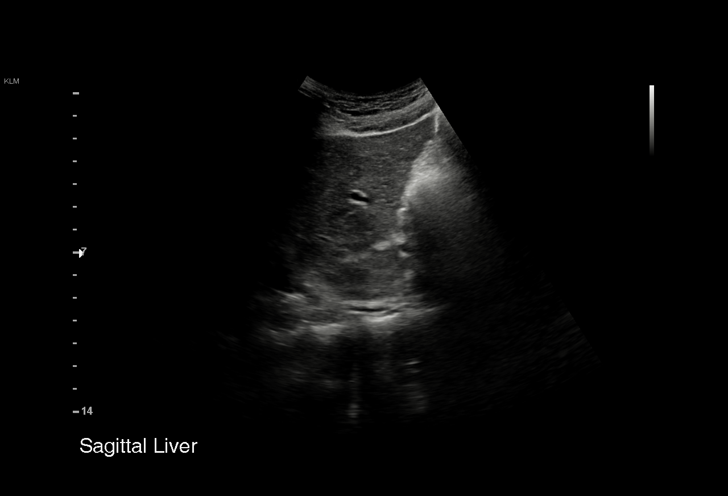
[im 5/30]
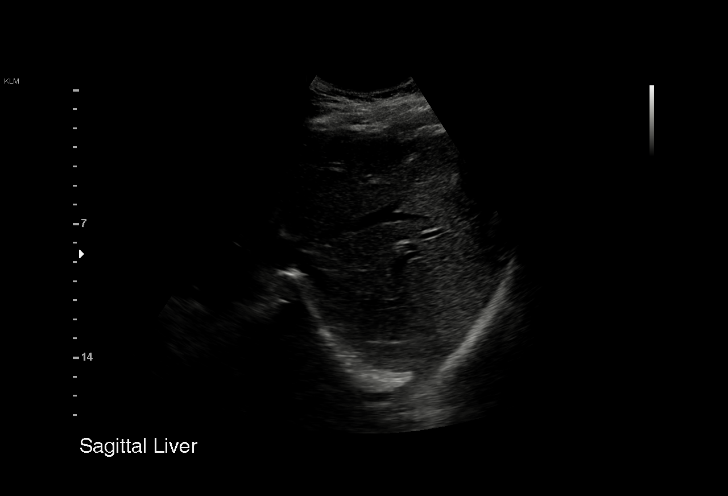
[im 7/30]
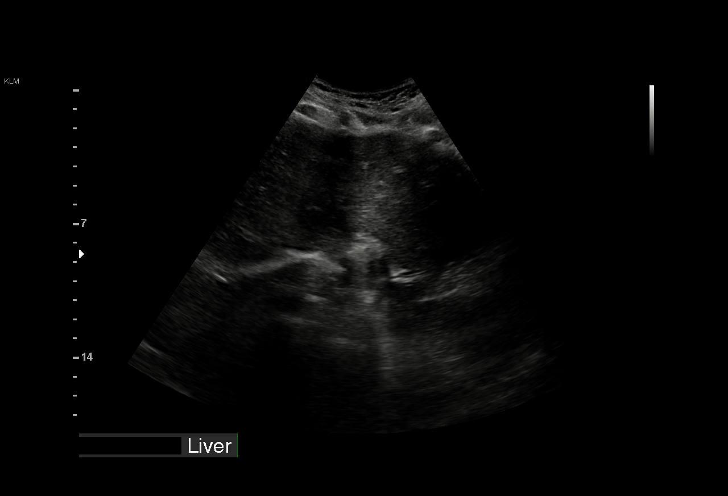
[im 9/30]
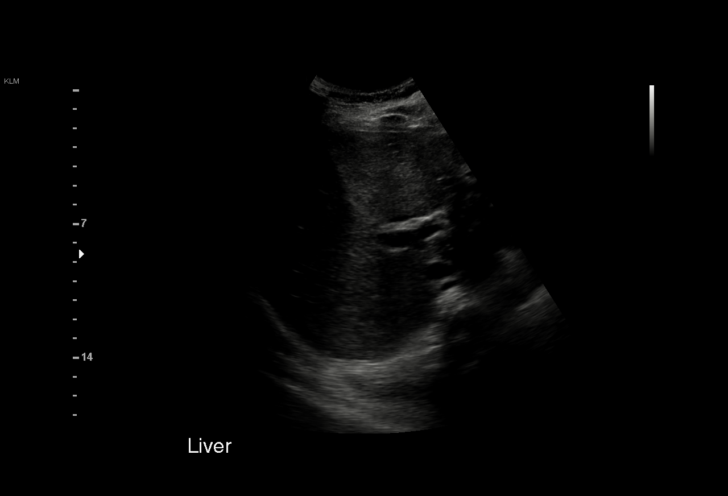
[im 11/30]
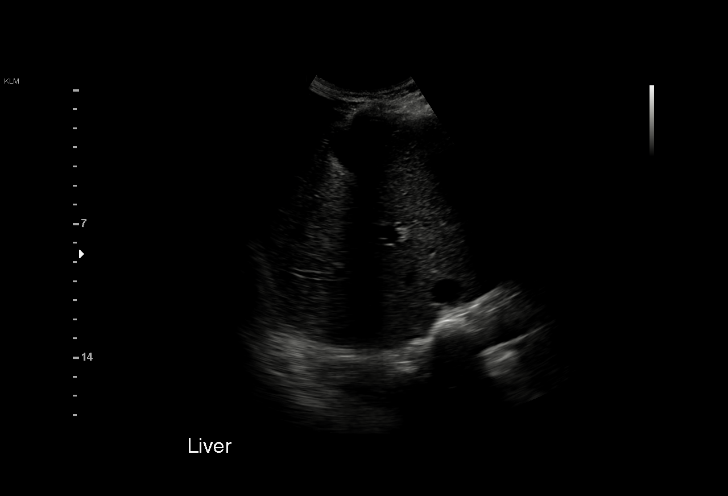
[im 13/30]
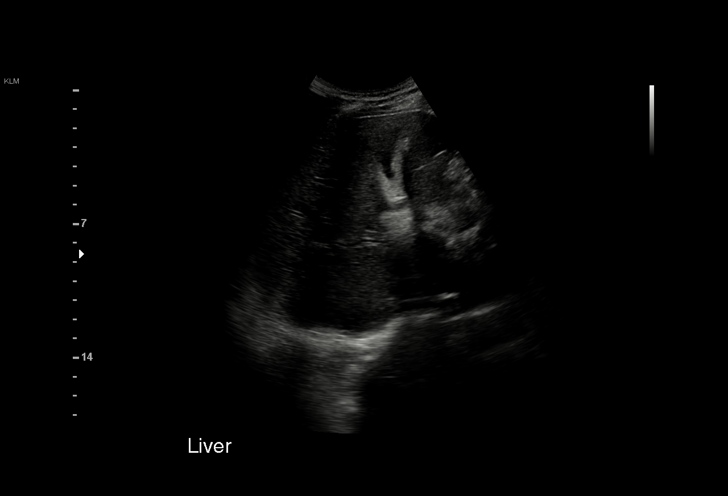
[im 15/30]
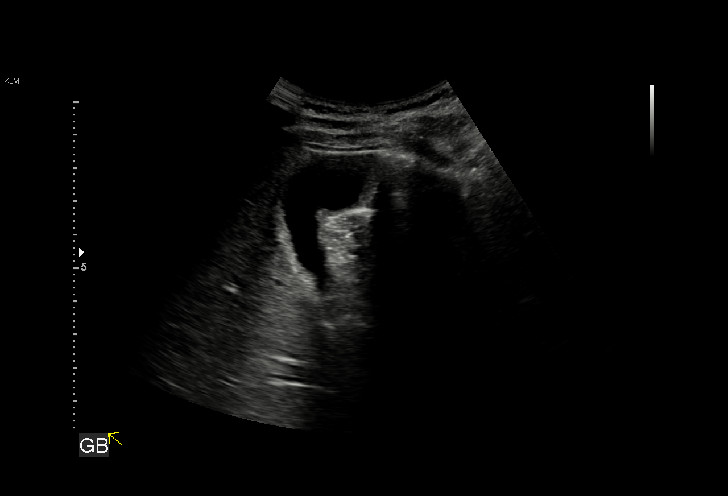
[im 17/30]
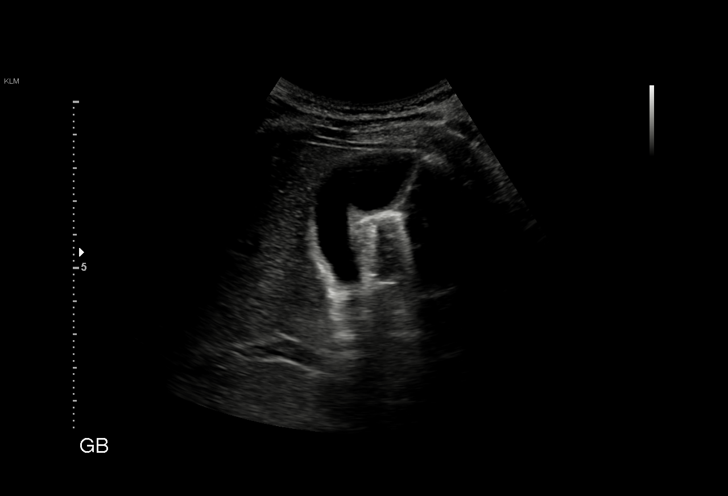
[im 19/30]
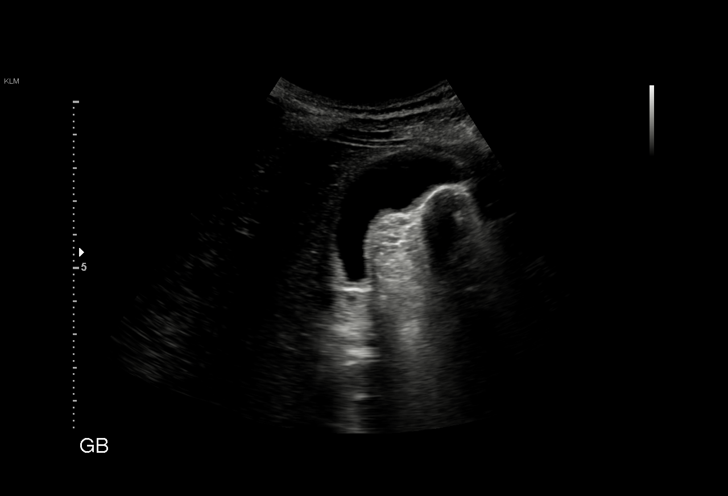
[im 21/30]
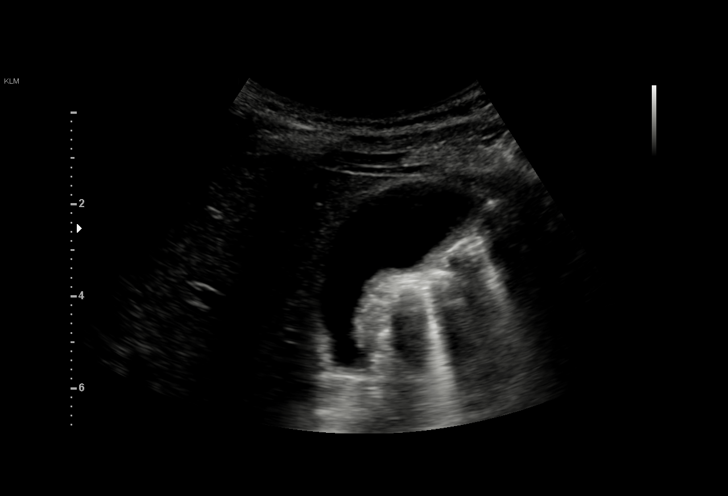
[im 23/30]
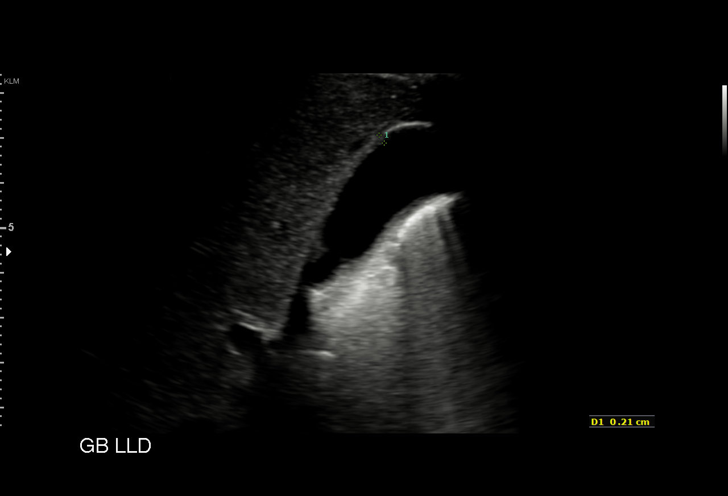
[im 25/30]
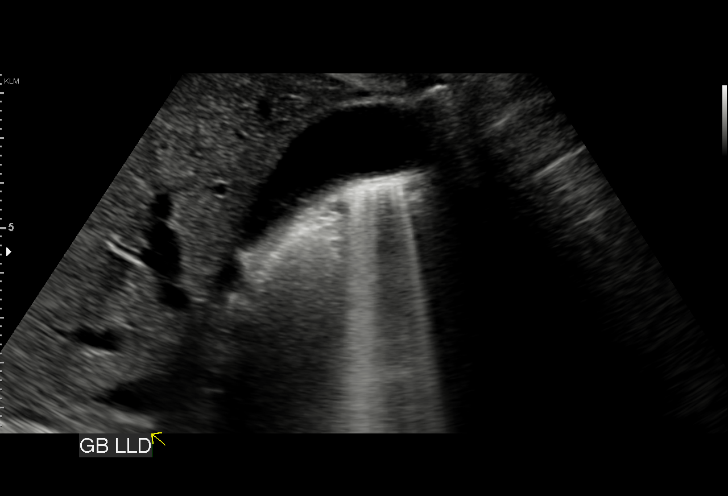
[im 27/30]
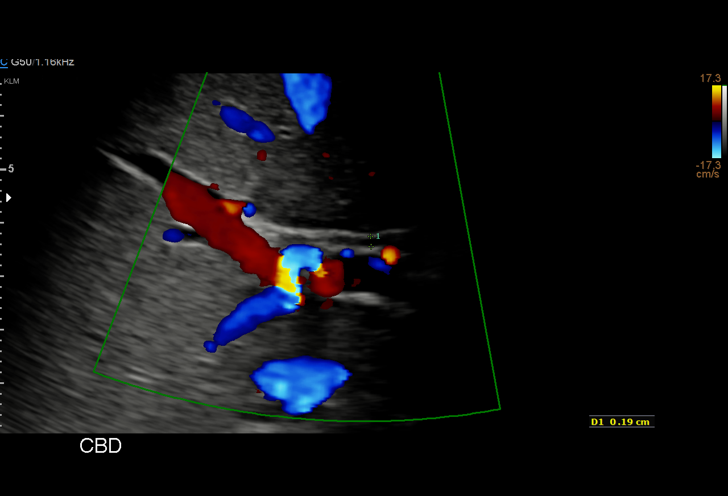
[im 30/30]
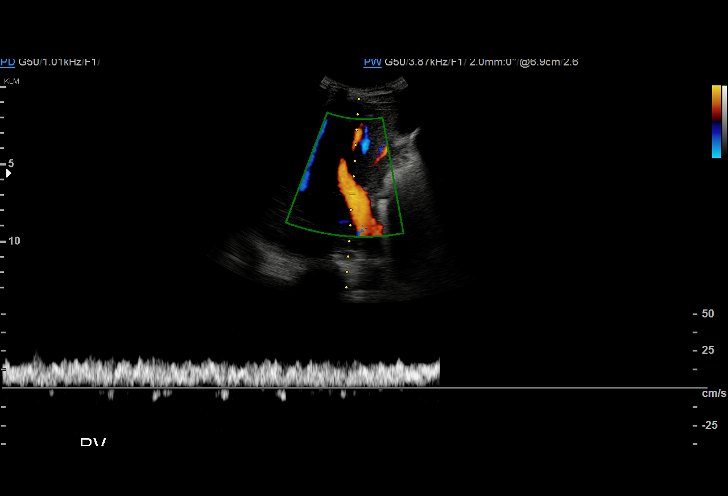

[15 of 25 positions shown; findings below may reference images not displayed]

FINDINGS: Gallbladder:

No gallstones or wall thickening visualized. No sonographic Murphy
sign noted by sonographer. No pericholecystic fluid.

Common bile duct:

Diameter: 2 millimeters, normal.

Liver:

No focal lesion identified. Within normal limits in parenchymal
echogenicity. Portal vein is patent on color Doppler imaging with
normal direction of blood flow towards the liver.
IMPRESSION: Normal right upper quadrant ultrasound.

## 2020-01-04 DIAGNOSIS — Z30011 Encounter for initial prescription of contraceptive pills: Secondary | ICD-10-CM | POA: Diagnosis not present

## 2020-01-04 DIAGNOSIS — Z0000 Encounter for general adult medical examination without abnormal findings: Secondary | ICD-10-CM | POA: Diagnosis not present

## 2020-01-04 DIAGNOSIS — Z309 Encounter for contraceptive management, unspecified: Secondary | ICD-10-CM | POA: Diagnosis not present

## 2020-01-04 DIAGNOSIS — Z113 Encounter for screening for infections with a predominantly sexual mode of transmission: Secondary | ICD-10-CM | POA: Diagnosis not present

## 2020-01-14 ENCOUNTER — Ambulatory Visit
Admission: EM | Admit: 2020-01-14 | Discharge: 2020-01-14 | Disposition: A | Discharge status: Home / Self Care | Payer: Medicaid Other | Attending: Physician Assistant | Admitting: Physician Assistant

## 2020-01-14 ENCOUNTER — Encounter: Payer: Self-pay | Admitting: Emergency Medicine

## 2020-01-14 ENCOUNTER — Other Ambulatory Visit: Payer: Self-pay

## 2020-01-14 DIAGNOSIS — N61 Mastitis without abscess: Secondary | ICD-10-CM

## 2020-01-14 MED ORDER — CEPHALEXIN 500 MG PO CAPS: 500.0000 mg | ORAL_CAPSULE | Freq: Four times a day (QID) | ORAL | 0 refills | Status: AC

## 2020-01-14 NOTE — ED Provider Notes (Signed)
[facility]    CSN: [id number] Arrival date & time: 01/14/20  6967      History   Chief Complaint Chief Complaint  Patient presents with  . Breast Pain    HPI Nicole Mosley is a 24 y.o. female.   24 year old female comes in for 5-day history of left breast pain, swelling, warmth, redness.  She stopped breast-feeding 1 week ago due to decreased milk production, as well as infant biting.  Denies obvious open wound.  Denies fever.  States has been taking Tylenol, doing ice and warm compresses, using cabbage leaves without relief.  Swelling has gotten worse, and therefore came in for evaluation.     Past Medical History:  Diagnosis Date  . Myocarditis (HCC)     There are no problems to display for this patient.   History reviewed. No pertinent surgical history.  OB History    Gravida  2   Para  1   Term  1   Preterm      AB      Living  1     SAB      TAB      Ectopic      Multiple      Live Births  1            Home Medications    Prior to Admission medications   Medication Sig Start Date End Date Taking? Authorizing Provider  acetaminophen (TYLENOL) 500 MG tablet Take 500 mg by mouth every 6 (six) hours as needed for moderate pain.    [provider]  cephALEXin (KEFLEX) 500 MG capsule Take 1 capsule (500 mg total) by mouth 4 (four) times daily. 01/14/20   Belinda Fisher, PA-C  Prenatal Vit-Fe Fumarate-FA (PRENATAL COMPLETE) 14-0.4 MG TABS Take 1 tablet by mouth daily. 07/27/16   Eyvonne Mechanic, PA-C    Family History History reviewed. No pertinent family history.  Social History Social History   Tobacco Use  . Smoking status: Never Smoker  . Smokeless tobacco: Never Used  Substance Use Topics  . Alcohol use: No    Comment: occ  . Drug use: No    Comment: occ     Allergies   Latex   Review of Systems Review of Systems  Reason unable to perform ROS: See HPI as above.     Physical Exam Triage  Vital Signs ED Triage Vitals  Enc Vitals Group     BP 01/14/20 0947 106/72     Pulse Rate 01/14/20 0947 76     Resp 01/14/20 0947 16     Temp 01/14/20 0947 98.1 F (36.7 C)     Temp Source 01/14/20 0947 Oral     SpO2 01/14/20 0947 97 %     Weight --      Height --      Head Circumference --      Peak Flow --      Pain Score 01/14/20 0949 10     Pain Loc --      Pain Edu? --      Excl. in GC? --    No data found.  Updated Vital Signs BP 106/72 (BP Location: Left Arm)   Pulse 76   Temp 98.1 F (36.7 C) (Oral)   Resp 16   LMP 01/04/2020   SpO2 97%   Physical Exam Constitutional:      General: She is not in acute distress.    Appearance: Normal appearance.  She is well-developed. She is not toxic-appearing or diaphoretic.  HENT:     Head: Normocephalic and atraumatic.  Eyes:     Conjunctiva/sclera: Conjunctivae normal.     Pupils: Pupils are equal, round, and reactive to light.  Pulmonary:     Effort: Pulmonary effort is normal. No respiratory distress.     Comments: Speaking in full sentences without difficulty Chest:     Comments: Left breast swelling with mild erythema to upper breast. Warmth diffusely. Diffuse tenderness to light touch, unable to adequately feel for masses. However, no obvious fluctuance felt.  Musculoskeletal:     Cervical back: Normal range of motion and neck supple.  Skin:    General: Skin is warm and dry.  Neurological:     Mental Status: She is alert and oriented to person, place, and time.      UC Treatments / Results  Labs (all labs ordered are listed, but only abnormal results are displayed) Labs Reviewed - No data to display  EKG   Radiology No results found.  Procedures Procedures (including critical care time)  Medications Ordered in UC Medications - No data to display  Initial Impression / Assessment and Plan / UC Course  I have reviewed the triage vital signs and the nursing notes.  Pertinent labs & imaging results  that were available during my care of the patient were reviewed by me and considered in my medical decision making (see chart for details).    No significant erythema, but with warmth and swelling, worsening despite symptomatic management.  Will start Keflex to cover for bacterial mastitis.  Limited exam due to tenderness, but no obvious abscesses felt.  Continue symptomatic treatment.  To follow-up with OB/GYN if symptoms not improving.  Return precautions given.  Patient expresses understanding and agrees to plan.  Final Clinical Impressions(s) / UC Diagnoses   Final diagnoses:  Acute mastitis of left breast    ED Prescriptions    Medication Sig Dispense Auth. Provider   cephALEXin (KEFLEX) 500 MG capsule Take 1 capsule (500 mg total) by mouth 4 (four) times daily. 28 capsule Belinda Fisher, PA-C     PDMP not reviewed this encounter.   Belinda Fisher, PA-C 01/14/20 1040

## 2020-01-14 NOTE — ED Triage Notes (Signed)
Pt presents to [facility] for assessment after stopping breast feeding 1 week ago, states she is having swelling, warmth and redness to her left breast x 5 days.

## 2020-01-14 NOTE — ED Notes (Signed)
Patient able to ambulate independently  

## 2020-01-14 NOTE — Discharge Instructions (Signed)
Start keflex as directed. Ibuprofen 600-800mg  three times a day. Follow up with OBGYN in 1-2 weeks for reevaluation.

## 2020-04-02 DIAGNOSIS — N926 Irregular menstruation, unspecified: Secondary | ICD-10-CM | POA: Diagnosis not present

## 2020-04-02 DIAGNOSIS — Z3041 Encounter for surveillance of contraceptive pills: Secondary | ICD-10-CM | POA: Diagnosis not present

## 2020-05-05 DIAGNOSIS — N6320 Unspecified lump in the left breast, unspecified quadrant: Secondary | ICD-10-CM | POA: Diagnosis not present

## 2020-05-05 DIAGNOSIS — R928 Other abnormal and inconclusive findings on diagnostic imaging of breast: Secondary | ICD-10-CM | POA: Diagnosis not present

## 2020-07-02 DIAGNOSIS — Z3041 Encounter for surveillance of contraceptive pills: Secondary | ICD-10-CM | POA: Diagnosis not present

## 2020-08-05 DIAGNOSIS — Z9189 Other specified personal risk factors, not elsewhere classified: Secondary | ICD-10-CM | POA: Diagnosis not present

## 2020-08-05 DIAGNOSIS — U071 COVID-19: Secondary | ICD-10-CM | POA: Diagnosis not present

## 2020-08-28 DIAGNOSIS — Z1159 Encounter for screening for other viral diseases: Secondary | ICD-10-CM | POA: Diagnosis not present

## 2020-08-28 DIAGNOSIS — R0602 Shortness of breath: Secondary | ICD-10-CM | POA: Diagnosis not present

## 2020-08-28 DIAGNOSIS — Z8616 Personal history of COVID-19: Secondary | ICD-10-CM | POA: Diagnosis not present

## 2020-09-24 DIAGNOSIS — Z03818 Encounter for observation for suspected exposure to other biological agents ruled out: Secondary | ICD-10-CM | POA: Diagnosis not present

## 2020-09-24 DIAGNOSIS — Z20822 Contact with and (suspected) exposure to covid-19: Secondary | ICD-10-CM | POA: Diagnosis not present

## 2020-12-22 ENCOUNTER — Other Ambulatory Visit: Payer: Self-pay

## 2020-12-22 ENCOUNTER — Ambulatory Visit (HOSPITAL_COMMUNITY)
Admission: EM | Admit: 2020-12-22 | Discharge: 2020-12-22 | Disposition: A | Discharge status: Home / Self Care | Payer: Medicaid Other

## 2020-12-22 ENCOUNTER — Encounter (HOSPITAL_COMMUNITY): Payer: Self-pay

## 2020-12-22 DIAGNOSIS — R202 Paresthesia of skin: Secondary | ICD-10-CM

## 2020-12-22 DIAGNOSIS — M546 Pain in thoracic spine: Secondary | ICD-10-CM

## 2020-12-22 MED ORDER — GABAPENTIN 300 MG PO CAPS: 300.0000 mg | ORAL_CAPSULE | Freq: Every day | ORAL | 0 refills | Status: AC

## 2020-12-22 MED ORDER — GABAPENTIN 300 MG PO CAPS: 300.0000 mg | ORAL_CAPSULE | Freq: Every day | ORAL | 0 refills | Status: DC

## 2020-12-22 MED ORDER — BACLOFEN 10 MG PO TABS: 10.0000 mg | ORAL_TABLET | Freq: Every day | ORAL | 0 refills | Status: AC | PRN

## 2020-12-22 MED ORDER — BACLOFEN 10 MG PO TABS: 10.0000 mg | ORAL_TABLET | Freq: Every day | ORAL | 0 refills | Status: DC | PRN

## 2020-12-22 NOTE — ED Triage Notes (Signed)
Pt reports right shoulder pain, right scapular pain, numbness and tingling right arm since this morning. Pt has not taken any meds for complaints.

## 2020-12-22 NOTE — Discharge Instructions (Addendum)
I believe that your symptoms are related to irritated nerves in your back.  Take gabapentin at night.  This can make you sleepy so please do not drive or drink alcohol taking it.  I recommend using Tylenol and ibuprofen during the day.  You can use baclofen to help with muscle spasms and tightness in your back but this will also make you sleepy do not drive or drink alcohol with it.  If your symptoms persist or worsen please return for reevaluation.

## 2020-12-22 NOTE — ED Provider Notes (Signed)
[facility]    CSN: [id number] Arrival date & time: 12/22/20  1722      History   Chief Complaint Chief Complaint  Patient presents with   Shoulder Injury    HPI Nicole Mosley is a 25 y.o. female.   Patient presents today with a 1 day history of pain inferior to right scapula.  She denies any known injury or increase in activity prior to symptom onset.  Reports symptoms began when she woke up today and have gradually worsened.  Pain is rated 10 on a 0-10 pain scale, localized to affected area without radiation, described as a burning sensation with intense aching, no aggravating or alleviating factors identified.  She has not tried any over-the-counter analgesics for pain relief.  Denies history of similar symptoms in the past.  She denies any fever, rash, weakness, numbness, dizziness, syncope.  She denies any medication changes.  She is having difficulty with daily activities as result of symptoms.   Past Medical History:  Diagnosis Date   Myocarditis (HCC)     There are no problems to display for this patient.   History reviewed. No pertinent surgical history.  OB History     Gravida  2   Para  1   Term  1   Preterm      AB      Living  1      SAB      IAB      Ectopic      Multiple      Live Births  1            Home Medications    Prior to Admission medications   Medication Sig Start Date End Date Taking? Authorizing Provider  desogestrel-ethinyl estradiol (APRI) 0.15-30 MG-MCG tablet Take active pills continuously, skipping the placebo pills, stopping active pills for only 3 days when bleed or desires bleed. 04/02/20 04/02/21 Yes [provider]  acetaminophen (TYLENOL) 500 MG tablet Take 500 mg by mouth every 6 (six) hours as needed for moderate pain.    [provider]  baclofen (LIORESAL) 10 MG tablet Take 1 tablet (10 mg total) by mouth daily as needed for muscle spasms. 12/22/20   Magaret Justo, Noberto Retort, PA-C   cephALEXin (KEFLEX) 500 MG capsule Take 1 capsule (500 mg total) by mouth 4 (four) times daily. 01/14/20   Cathie Hoops, Amy V, PA-C  gabapentin (NEURONTIN) 300 MG capsule Take 1 capsule (300 mg total) by mouth at bedtime. 12/22/20   Ruey Storer, Noberto Retort, PA-C  Prenatal Vit-Fe Fumarate-FA (PRENATAL COMPLETE) 14-0.4 MG TABS Take 1 tablet by mouth daily. 07/27/16   Eyvonne Mechanic, PA-C    Family History History reviewed. No pertinent family history.  Social History Social History   Tobacco Use   Smoking status: Never   Smokeless tobacco: Never  Substance Use Topics   Alcohol use: No    Comment: occ   Drug use: No    Comment: occ     Allergies   Latex   Review of Systems Review of Systems  Constitutional:  Positive for activity change. Negative for appetite change, fatigue and fever.  Respiratory:  Negative for cough and shortness of breath.   Cardiovascular:  Negative for chest pain.  Gastrointestinal:  Negative for abdominal pain, diarrhea, nausea and vomiting.  Musculoskeletal:  Positive for back pain. Negative for arthralgias and myalgias.  Neurological:  Negative for dizziness, weakness, light-headedness, numbness and headaches.    Physical Exam Triage  Vital Signs ED Triage Vitals  Enc Vitals Group     BP 12/22/20 2004 136/81     Pulse Rate 12/22/20 2004 75     Resp 12/22/20 2004 16     Temp 12/22/20 2004 98.3 F (36.8 C)     Temp Source 12/22/20 2004 Oral     SpO2 12/22/20 2004 99 %     Weight --      Height --      Head Circumference --      Peak Flow --      Pain Score 12/22/20 2002 10     Pain Loc --      Pain Edu? --      Excl. in GC? --    No data found.  Updated Vital Signs BP 136/81 (BP Location: Left Arm)   Pulse 75   Temp 98.3 F (36.8 C) (Oral)   Resp 16   LMP 11/28/2020 (Exact Date)   SpO2 99%   Visual Acuity Right Eye Distance:   Left Eye Distance:   Bilateral Distance:    Right Eye Near:   Left Eye Near:    Bilateral Near:     Physical  Exam Vitals reviewed.  Constitutional:      General: She is awake. She is not in acute distress.    Appearance: Normal appearance. She is normal weight. She is not ill-appearing.     Comments: Very pleasant female appears stated age in no acute distress  HENT:     Head: Normocephalic and atraumatic.  Cardiovascular:     Rate and Rhythm: Normal rate and regular rhythm.     Heart sounds: Normal heart sounds, S1 normal and S2 normal. No murmur heard. Pulmonary:     Effort: Pulmonary effort is normal.     Breath sounds: Normal breath sounds. No wheezing, rhonchi or rales.  Abdominal:     Palpations: Abdomen is soft.     Tenderness: There is no abdominal tenderness.  Musculoskeletal:     Cervical back: No tenderness or bony tenderness.     Thoracic back: Tenderness present. No bony tenderness.     Lumbar back: No tenderness or bony tenderness.       Back:     Comments: Back: No pain percussion of vertebrae.  Tenderness palpation of right thoracic paraspinal muscles.  No deformity or step-off noted.  Psychiatric:        Behavior: Behavior is cooperative.     UC Treatments / Results  Labs (all labs ordered are listed, but only abnormal results are displayed) Labs Reviewed - No data to display  EKG   Radiology No results found.  Procedures Procedures (including critical care time)  Medications Ordered in UC Medications - No data to display  Initial Impression / Assessment and Plan / UC Course  I have reviewed the triage vital signs and the nursing notes.  Pertinent labs & imaging results that were available during my care of the patient were reviewed by me and considered in my medical decision making (see chart for details).      No indication for x-ray given lack of bony tenderness on exam and atraumatic injury.  Concern for notalgia paresthetica given clinical presentation.  Patient was started on gabapentin 300 mg at night.  She was given baclofen to be used during  the day with instruction not to drive or drink alcohol with this medication as drowsiness is a common side effect.  She can alternate Tylenol and ibuprofen.  Discussed that if symptoms persist would consider referral to neurology but this would need to be performed with PCP.  Discussed alarm symptoms that warrant emergent evaluation.  Strict return precautions given to which patient expressed understanding.  Final Clinical Impressions(s) / UC Diagnoses   Final diagnoses:  Acute right-sided thoracic back pain  Notalgia paresthetica     Discharge Instructions      I believe that your symptoms are related to irritated nerves in your back.  Take gabapentin at night.  This can make you sleepy so please do not drive or drink alcohol taking it.  I recommend using Tylenol and ibuprofen during the day.  You can use baclofen to help with muscle spasms and tightness in your back but this will also make you sleepy do not drive or drink alcohol with it.  If your symptoms persist or worsen please return for reevaluation.     ED Prescriptions     Medication Sig Dispense Auth. Provider   gabapentin (NEURONTIN) 300 MG capsule  (Status: Discontinued) Take 1 capsule (300 mg total) by mouth at bedtime. 30 capsule Gaynell Eggleton K, PA-C   baclofen (LIORESAL) 10 MG tablet  (Status: Discontinued) Take 1 tablet (10 mg total) by mouth daily as needed for muscle spasms. 10 each Jahaira Earnhart K, PA-C   gabapentin (NEURONTIN) 300 MG capsule Take 1 capsule (300 mg total) by mouth at bedtime. 30 capsule Zackariah Vanderpol K, PA-C   baclofen (LIORESAL) 10 MG tablet Take 1 tablet (10 mg total) by mouth daily as needed for muscle spasms. 10 each Marv Alfrey, Noberto Retort, PA-C      PDMP not reviewed this encounter.   Jeani Hawking, PA-C 12/22/20 2101

## 2021-07-13 DIAGNOSIS — N12 Tubulo-interstitial nephritis, not specified as acute or chronic: Secondary | ICD-10-CM | POA: Diagnosis not present

## 2021-07-28 DIAGNOSIS — R300 Dysuria: Secondary | ICD-10-CM | POA: Diagnosis not present

## 2021-07-28 DIAGNOSIS — N898 Other specified noninflammatory disorders of vagina: Secondary | ICD-10-CM | POA: Diagnosis not present
